# Patient Record
Sex: Female | Born: 1970
Health system: Southern US, Community
[De-identification: ages and names within clinical notes are randomized; demographics above are authoritative.]

## PROBLEM LIST (undated history)

## (undated) DIAGNOSIS — K219 Gastro-esophageal reflux disease without esophagitis: Secondary | ICD-10-CM

## (undated) DIAGNOSIS — Z87440 Personal history of urinary (tract) infections: Secondary | ICD-10-CM

## (undated) DIAGNOSIS — I10 Essential (primary) hypertension: Secondary | ICD-10-CM

## (undated) DIAGNOSIS — Z803 Family history of malignant neoplasm of breast: Secondary | ICD-10-CM

## (undated) HISTORY — DX: Personal history of urinary (tract) infections: Z87.440

## (undated) HISTORY — PX: CARPAL TUNNEL RELEASE: SHX101

## (undated) HISTORY — DX: Family history of malignant neoplasm of breast: Z80.3

## (undated) HISTORY — DX: Gastro-esophageal reflux disease without esophagitis: K21.9

## (undated) HISTORY — PX: THUMB ARTHROSCOPY: SHX2509

## (undated) HISTORY — PX: APPENDECTOMY: SHX54

## (undated) HISTORY — PX: SHOULDER SURGERY: SHX246

## (undated) HISTORY — DX: Essential (primary) hypertension: I10

---

## 1991-01-05 HISTORY — PX: TUBAL LIGATION: SHX77

## 1997-11-26 ENCOUNTER — Other Ambulatory Visit: Admission: RE | Admit: 1997-11-26 | Discharge: 1997-11-26 | Payer: Self-pay | Admitting: Family Medicine

## 1998-03-11 ENCOUNTER — Ambulatory Visit (HOSPITAL_BASED_OUTPATIENT_CLINIC_OR_DEPARTMENT_OTHER): Admission: RE | Admit: 1998-03-11 | Discharge: 1998-03-11 | Payer: Self-pay | Admitting: Plastic Surgery

## 1999-02-11 ENCOUNTER — Other Ambulatory Visit: Admission: RE | Admit: 1999-02-11 | Discharge: 1999-02-11 | Payer: Self-pay | Admitting: *Deleted

## 2000-08-10 ENCOUNTER — Other Ambulatory Visit: Admission: RE | Admit: 2000-08-10 | Discharge: 2000-08-10 | Payer: Self-pay | Admitting: Obstetrics and Gynecology

## 2000-10-06 ENCOUNTER — Ambulatory Visit (HOSPITAL_COMMUNITY): Admission: RE | Admit: 2000-10-06 | Discharge: 2000-10-06 | Payer: Self-pay | Admitting: Obstetrics and Gynecology

## 2000-10-26 ENCOUNTER — Encounter: Payer: Self-pay | Admitting: Gastroenterology

## 2000-10-26 ENCOUNTER — Encounter: Admission: RE | Admit: 2000-10-26 | Discharge: 2000-10-26 | Payer: Self-pay | Admitting: Gastroenterology

## 2000-11-14 ENCOUNTER — Ambulatory Visit (HOSPITAL_BASED_OUTPATIENT_CLINIC_OR_DEPARTMENT_OTHER): Admission: RE | Admit: 2000-11-14 | Discharge: 2000-11-14 | Payer: Self-pay | Admitting: Urology

## 2001-03-08 ENCOUNTER — Encounter: Admission: RE | Admit: 2001-03-08 | Discharge: 2001-05-04 | Payer: Self-pay

## 2004-01-05 HISTORY — PX: RIGHT OOPHORECTOMY: SHX2359

## 2005-06-16 ENCOUNTER — Encounter: Admission: RE | Admit: 2005-06-16 | Discharge: 2005-06-29 | Payer: Self-pay | Admitting: Family Medicine

## 2010-05-28 ENCOUNTER — Ambulatory Visit: Payer: BC Managed Care – PPO | Attending: Neurology | Admitting: Physical Therapy

## 2010-05-28 DIAGNOSIS — M546 Pain in thoracic spine: Secondary | ICD-10-CM | POA: Insufficient documentation

## 2010-05-28 DIAGNOSIS — M542 Cervicalgia: Secondary | ICD-10-CM | POA: Insufficient documentation

## 2010-05-28 DIAGNOSIS — IMO0001 Reserved for inherently not codable concepts without codable children: Secondary | ICD-10-CM | POA: Insufficient documentation

## 2010-06-02 ENCOUNTER — Ambulatory Visit: Payer: BC Managed Care – PPO | Admitting: Physical Therapy

## 2010-06-04 ENCOUNTER — Ambulatory Visit: Payer: BC Managed Care – PPO | Admitting: Physical Therapy

## 2010-06-09 ENCOUNTER — Ambulatory Visit: Payer: BC Managed Care – PPO | Attending: Neurology | Admitting: Physical Therapy

## 2010-06-09 DIAGNOSIS — IMO0001 Reserved for inherently not codable concepts without codable children: Secondary | ICD-10-CM | POA: Insufficient documentation

## 2010-06-09 DIAGNOSIS — M542 Cervicalgia: Secondary | ICD-10-CM | POA: Insufficient documentation

## 2010-06-09 DIAGNOSIS — M546 Pain in thoracic spine: Secondary | ICD-10-CM | POA: Insufficient documentation

## 2010-06-11 ENCOUNTER — Ambulatory Visit: Payer: BC Managed Care – PPO | Admitting: Physical Therapy

## 2010-06-16 ENCOUNTER — Ambulatory Visit: Payer: BC Managed Care – PPO | Admitting: Physical Therapy

## 2010-06-18 ENCOUNTER — Encounter: Payer: BC Managed Care – PPO | Admitting: Physical Therapy

## 2010-06-23 ENCOUNTER — Ambulatory Visit: Payer: BC Managed Care – PPO | Admitting: Physical Therapy

## 2010-06-25 ENCOUNTER — Ambulatory Visit: Payer: BC Managed Care – PPO | Admitting: Physical Therapy

## 2010-06-30 ENCOUNTER — Encounter: Payer: BC Managed Care – PPO | Admitting: Physical Therapy

## 2010-07-02 ENCOUNTER — Encounter: Payer: BC Managed Care – PPO | Admitting: Physical Therapy

## 2010-07-07 ENCOUNTER — Encounter: Payer: BC Managed Care – PPO | Admitting: Physical Therapy

## 2011-03-03 ENCOUNTER — Ambulatory Visit: Payer: Self-pay | Admitting: Obstetrics and Gynecology

## 2011-03-18 ENCOUNTER — Ambulatory Visit: Payer: Self-pay | Admitting: Obstetrics and Gynecology

## 2011-03-18 LAB — HEMOGLOBIN: HGB: 13 g/dL (ref 12.0–16.0)

## 2011-03-25 ENCOUNTER — Ambulatory Visit: Payer: Self-pay | Admitting: Obstetrics and Gynecology

## 2011-03-25 HISTORY — PX: ENDOMETRIAL ABLATION: SHX621

## 2011-04-20 ENCOUNTER — Other Ambulatory Visit: Payer: Self-pay | Admitting: Family Medicine

## 2011-04-20 ENCOUNTER — Ambulatory Visit (HOSPITAL_COMMUNITY)
Admission: RE | Admit: 2011-04-20 | Discharge: 2011-04-20 | Disposition: A | Discharge status: Home / Self Care | Payer: BC Managed Care – PPO | Source: Ambulatory Visit | Attending: Family Medicine | Admitting: Family Medicine

## 2011-04-20 DIAGNOSIS — R1011 Right upper quadrant pain: Secondary | ICD-10-CM

## 2011-04-20 DIAGNOSIS — K838 Other specified diseases of biliary tract: Secondary | ICD-10-CM | POA: Insufficient documentation

## 2011-04-29 ENCOUNTER — Encounter: Payer: Self-pay | Admitting: Gastroenterology

## 2011-05-21 ENCOUNTER — Other Ambulatory Visit (INDEPENDENT_AMBULATORY_CARE_PROVIDER_SITE_OTHER): Payer: BC Managed Care – PPO

## 2011-05-21 ENCOUNTER — Ambulatory Visit (INDEPENDENT_AMBULATORY_CARE_PROVIDER_SITE_OTHER): Payer: BC Managed Care – PPO | Admitting: Gastroenterology

## 2011-05-21 ENCOUNTER — Encounter: Payer: Self-pay | Admitting: Gastroenterology

## 2011-05-21 VITALS — BP 124/68 | HR 60 | Ht 70.0 in | Wt 184.0 lb

## 2011-05-21 DIAGNOSIS — R1011 Right upper quadrant pain: Secondary | ICD-10-CM

## 2011-05-21 LAB — CBC WITH DIFFERENTIAL/PLATELET
Basophils Absolute: 0 10*3/uL (ref 0.0–0.1)
Hemoglobin: 13.8 g/dL (ref 12.0–15.0)
Lymphocytes Relative: 33.1 % (ref 12.0–46.0)
Monocytes Relative: 9.2 % (ref 3.0–12.0)
Neutro Abs: 2.8 10*3/uL (ref 1.4–7.7)
Neutrophils Relative %: 54.2 % (ref 43.0–77.0)
Platelets: 275 10*3/uL (ref 150.0–400.0)
RDW: 13.6 % (ref 11.5–14.6)

## 2011-05-21 LAB — COMPREHENSIVE METABOLIC PANEL
ALT: 15 U/L (ref 0–35)
CO2: 27 mEq/L (ref 19–32)
Calcium: 9.2 mg/dL (ref 8.4–10.5)
Chloride: 112 mEq/L (ref 96–112)
Creatinine, Ser: 0.8 mg/dL (ref 0.4–1.2)
GFR: 79.8 mL/min (ref >60.00)

## 2011-05-21 NOTE — Progress Notes (Signed)
HPI: This is a   very pleasant 41 year old woman whom I am meeting for the first time today.  Sharp RUQ pains starting this past February.  The pains are colicy, occuring daily lately.  30 seconds at an episode, several times a day.  Can also be a constant dull pain.  +constant nausea.  Pain does not radiate.  The pains are not brought on by eating or relieved when moving her bowels.  Takes ibuprofen because of this RUQ pains.  NO other NSAIDs.  She does not get heartburn  Overall she has gained 10 pounds.  [place] see below.  CBC, complete metabolic profile last month were both completely normal.  IMPRESSION from recent US last month: 1. Mildly dilated common bile duct measures up to 0.6 cm. No directly visualized choledocholithiasis or intrahepatic biliary dilatation. Because of the mild CBD dilatation is not observed. 2. There are two small hepatic cysts which appear benign and incidental.    Review of systems: Pertinent positive and negative review of systems were noted in the above HPI section. Complete review of systems was performed and was otherwise normal.    Past Medical History  Diagnosis Date  . Hx: UTI (urinary tract infection)   . Endometriosis   . GERD (gastroesophageal reflux disease)     Past Surgical History  Procedure Date  . Appendectomy   . Right oophorectomy     Current Outpatient Prescriptions  Medication Sig Dispense Refill  . ibuprofen (ADVIL,MOTRIN) 200 MG tablet Take 200 mg by mouth as needed.      . promethazine (PHENERGAN) 25 MG tablet Take 25 mg by mouth every 6 (six) hours as needed.        Allergies as of 05/21/2011  . (No Known Allergies)    Family History  Problem Relation Age of Onset  . Colon cancer Neg Hx   . Breast cancer Paternal Grandmother     History   Social History  . Marital Status: Married    Spouse Name: N/A    Number of Children: 2  . Years of Education: N/A   Occupational History  . Furniture conservator/restorer    Social  History Main Topics  . Smoking status: Never Smoker   . Smokeless tobacco: Never Used  . Alcohol Use: Yes     occ  . Drug Use: No  . Sexually Active: Not on file   Other Topics Concern  . Not on file   Social History Narrative   Daily caffeine        Physical Exam: BP 124/68  Pulse 60  Ht 5\' 10"  (1.778 m)  Wt 184 lb (83.462 kg)  BMI 26.40 kg/m2 Constitutional: generally well-appearing Psychiatric: alert and oriented x3 Eyes: extraocular movements intact Mouth: oral pharynx moist, no lesions Neck: supple no lymphadenopathy Cardiovascular: heart regular rate and rhythm Lungs: clear to auscultation bilaterally Abdomen: soft, nontender, nondistended, no obvious ascites, no peritoneal signs, normal bowel sounds Extremities: no lower extremity edema bilaterally Skin: no lesions on visible extremities    Assessment and plan: 41 y.o. female with  intermittent right upper quadrant pains  Her symptoms are most consistent with a biliary problem. Gallbladder looked normal on ultrasound however I am going to set up a HIDA scan to estimate gallbladder ejection fraction, check for biliary dyskinesia. The ultrasound also suggested a slightly dilated, upper limit of normal bile duct. Perhaps this is a clue to her problem. If HIDA scan is not helpful then I would set up MRI  abdomen with MRCP images. She is going to get repeat CBC and complete profile today.  Her symptoms do not seem gastric.

## 2011-05-21 NOTE — Patient Instructions (Addendum)
You will have labs checked today in the basement lab.  Please head down after you check out with the front desk  (cbc, cmet). HIDA scan to estimate GB ejection fraction (functional test of your GB).  If this is not helpful, then MRI/MRCP. You will need to arrive at [facility] on 05/26/11 at 12:45 pm for a 1 pm appointment.  Please have nothing to eat or drink for 6 hours prior to the test.

## 2011-05-26 ENCOUNTER — Encounter (HOSPITAL_COMMUNITY): Payer: Self-pay

## 2011-05-26 ENCOUNTER — Encounter (HOSPITAL_COMMUNITY)
Admission: RE | Admit: 2011-05-26 | Discharge: 2011-05-26 | Disposition: A | Discharge status: Home / Self Care | Payer: BC Managed Care – PPO | Source: Ambulatory Visit | Attending: Gastroenterology | Admitting: Gastroenterology

## 2011-05-26 DIAGNOSIS — R1011 Right upper quadrant pain: Secondary | ICD-10-CM

## 2011-05-26 DIAGNOSIS — R112 Nausea with vomiting, unspecified: Secondary | ICD-10-CM | POA: Insufficient documentation

## 2011-05-26 MED ORDER — TECHNETIUM TC 99M MEBROFENIN IV KIT
4.9000 | PACK | Freq: Once | INTRAVENOUS | Status: AC | PRN
  Administered 2011-05-26: 4.9 via INTRAVENOUS

## 2011-05-26 MED ORDER — SINCALIDE 5 MCG IJ SOLR: 0.0200 ug/kg | Freq: Once | INTRAMUSCULAR | Status: DC

## 2011-05-28 ENCOUNTER — Other Ambulatory Visit: Payer: Self-pay

## 2011-05-28 DIAGNOSIS — R1011 Right upper quadrant pain: Secondary | ICD-10-CM

## 2011-05-28 NOTE — Progress Notes (Signed)
Pt has been scheduled for MRI on 06/04/11 NPO 4 hours arrive at 1145 am pt is aware of the date and time and instructions

## 2011-06-02 ENCOUNTER — Other Ambulatory Visit: Payer: Self-pay | Admitting: Gastroenterology

## 2011-06-02 DIAGNOSIS — R1011 Right upper quadrant pain: Secondary | ICD-10-CM

## 2011-06-04 ENCOUNTER — Ambulatory Visit (HOSPITAL_COMMUNITY)
Admission: RE | Admit: 2011-06-04 | Discharge: 2011-06-04 | Disposition: A | Discharge status: Home / Self Care | Payer: BC Managed Care – PPO | Source: Ambulatory Visit | Attending: Gastroenterology | Admitting: Gastroenterology

## 2011-06-04 DIAGNOSIS — R1011 Right upper quadrant pain: Secondary | ICD-10-CM | POA: Insufficient documentation

## 2011-06-04 MED ORDER — GADOBENATE DIMEGLUMINE 529 MG/ML IV SOLN
20.0000 mL | Freq: Once | INTRAVENOUS | Status: AC | PRN
  Administered 2011-06-04: 17 mL via INTRAVENOUS

## 2011-06-09 ENCOUNTER — Telehealth: Payer: Self-pay | Admitting: Gastroenterology

## 2011-06-09 NOTE — Telephone Encounter (Signed)
MRI shows normal bile duct. No clear cause of her pains. Lets proceed with EGD given otherwise normal examination (LEC)  Pt has been scheduled for EGD and Previsit letter mailed

## 2011-06-22 ENCOUNTER — Encounter: Payer: Self-pay | Admitting: Gastroenterology

## 2011-06-22 ENCOUNTER — Ambulatory Visit (AMBULATORY_SURGERY_CENTER): Payer: BC Managed Care – PPO

## 2011-06-22 VITALS — Ht 70.0 in | Wt 186.7 lb

## 2011-06-22 DIAGNOSIS — G8929 Other chronic pain: Secondary | ICD-10-CM

## 2011-06-22 DIAGNOSIS — R112 Nausea with vomiting, unspecified: Secondary | ICD-10-CM

## 2011-06-22 DIAGNOSIS — R1011 Right upper quadrant pain: Secondary | ICD-10-CM

## 2011-07-01 ENCOUNTER — Telehealth: Payer: Self-pay

## 2011-07-01 NOTE — Telephone Encounter (Signed)
Message copied by Donata Duff on Thu Jul 01, 2011  8:03 AM ------      Message from: Rob Bunting P      Created: Thu Jul 01, 2011  7:37 AM       She is scheduled for 4pm egd tomorro [facility], can you see if she'll reschedule for 1:30 pm instead?            Thanks

## 2011-07-01 NOTE — Telephone Encounter (Signed)
Pt agreed to come in at 130 pm for appt she was re instructed for the new time

## 2011-07-02 ENCOUNTER — Ambulatory Visit (AMBULATORY_SURGERY_CENTER): Payer: BC Managed Care – PPO | Admitting: Gastroenterology

## 2011-07-02 ENCOUNTER — Encounter: Payer: Self-pay | Admitting: Gastroenterology

## 2011-07-02 VITALS — BP 108/69 | HR 65 | Temp 98.7°F | Resp 18 | Ht 70.0 in | Wt 186.0 lb

## 2011-07-02 DIAGNOSIS — R112 Nausea with vomiting, unspecified: Secondary | ICD-10-CM

## 2011-07-02 DIAGNOSIS — R1011 Right upper quadrant pain: Secondary | ICD-10-CM

## 2011-07-02 MED ORDER — SODIUM CHLORIDE 0.9 % IV SOLN: 500.0000 mL | INTRAVENOUS | Status: DC

## 2011-07-02 NOTE — Op Note (Signed)
[facility] [address] [place], [place]  [address]  ENDOSCOPY PROCEDURE REPORT  PATIENT:  Courtney, Brooks  MR#:  [id number] BIRTHDATE:  1970/03/02, 40 yrs. old  GENDER:  female ENDOSCOPIST:  Rachael Fee, MD Referred by:  Rudi Heap, M.D. PROCEDURE DATE:  07/02/2011 PROCEDURE:  EGD, diagnostic 43235 ASA CLASS:  Class II INDICATIONS:  intermittent RUQ pain, normal [place], normal HIDA, essentially normal MRCP (cbd 5mm), normal LFTs MEDICATIONS:  Fentanyl 50 mcg IV, These medications were titrated to patient response per physician's verbal order, Versed 5 mg IV TOPICAL ANESTHETIC:  none  DESCRIPTION OF PROCEDURE:   After the risks benefits and alternatives of the procedure were thoroughly explained, informed consent was obtained.  The LB GIF-H180 [id number] endoscope was introduced through the mouth and advanced to the second portion of the duodenum, without limitations.  The instrument was slowly withdrawn as the mucosa was fully examined. <<PROCEDUREIMAGES>> The upper, middle, and distal third of the esophagus were carefully inspected and no abnormalities were noted. The z-line was well seen at the GEJ. The endoscope was pushed into the fundus which was normal including a retroflexed view. The antrum,gastric body, first and second part of the duodenum were unremarkable (see image1, image2, image3, image4, and image5).    Retroflexed views revealed no abnormalities.    The scope was then withdrawn from the patient and the procedure completed. COMPLICATIONS:  None  ENDOSCOPIC IMPRESSION: 1) Normal EGD  RECOMMENDATIONS: Still no clear reason for her pains.  Will treat her underlying constipation (can go 4-5 days without BM) and see if that helps. Start miralax one dose daily and then return to see me in the office in 5-6 weeks.  ______________________________ Rachael Fee, MD  n. eSIGNED:   Rachael Fee at 07/02/2011 01:43 PM  Theodis Sato, [phone]

## 2011-07-02 NOTE — Patient Instructions (Addendum)
YOU HAD AN ENDOSCOPIC PROCEDURE TODAY AT THE [facility]: Refer to the procedure report that was given to you for any specific questions about what was found during the examination.  If the procedure report does not answer your questions, please call your gastroenterologist to clarify.  If you requested that your care partner not be given the details of your procedure findings, then the procedure report has been included in a sealed envelope for you to review at your convenience later.  YOU SHOULD EXPECT: Some feelings of bloating in the abdomen. Passage of more gas than usual.  Walking can help get rid of the air that was put into your GI tract during the procedure and reduce the bloating. If you had a lower endoscopy (such as a colonoscopy or flexible sigmoidoscopy) you may notice spotting of blood in your stool or on the toilet paper. If you underwent a bowel prep for your procedure, then you may not have a normal bowel movement for a few days.  DIET: Your first meal following the procedure should be a light meal and then it is ok to progress to your normal diet.  A half-sandwich or bowl of soup is an example of a good first meal.  Heavy or fried foods are harder to digest and may make you feel nauseous or bloated.  Likewise meals heavy in dairy and vegetables can cause extra gas to form and this can also increase the bloating.  Drink plenty of fluids but you should avoid alcoholic beverages for 24 hours.  ACTIVITY: Your care partner should take you home directly after the procedure.  You should plan to take it easy, moving slowly for the rest of the day.  You can resume normal activity the day after the procedure however you should NOT DRIVE or use heavy machinery for 24 hours (because of the sedation medicines used during the test).    SYMPTOMS TO REPORT IMMEDIATELY: A gastroenterologist can be reached at any hour.  During normal business hours, 8:30 AM to 5:00 PM Monday through Friday,  call [phone].  After hours and on weekends, please call the GI answering service at [phone] who will take a message and have the physician on call contact you.   Following lower endoscopy (colonoscopy or flexible sigmoidoscopy):  Excessive amounts of blood in the stool  Significant tenderness or worsening of abdominal pains  Swelling of the abdomen that is new, acute  Fever of 100F or higher  Following upper endoscopy (EGD)  Vomiting of blood or coffee ground material  New chest pain or pain under the shoulder blades  Painful or persistently difficult swallowing  New shortness of breath  Fever of 100F or higher  Black, tarry-looking stools  FOLLOW UP: If any biopsies were taken you will be contacted by phone or by letter within the next 1-3 weeks.  Call your gastroenterologist if you have not heard about the biopsies in 3 weeks.  Our staff will call the home number listed on your records the next business day following your procedure to check on you and address any questions or concerns that you may have at that time regarding the information given to you following your procedure. This is a courtesy call and so if there is no answer at the home number and we have not heard from you through the emergency physician on call, we will assume that you have returned to your regular daily activities without incident.  SIGNATURES/CONFIDENTIALITY: You and/or your care   partner have signed paperwork which will be entered into your electronic medical record.  These signatures attest to the fact that that the information above on your After Visit Summary has been reviewed and is understood.  Full responsibility of the confidentiality of this discharge information lies with you and/or your care-partner.  

## 2011-07-02 NOTE — Progress Notes (Signed)
Patient did not experience any of the following events: a burn prior to discharge; a fall within the facility; wrong site/side/patient/procedure/implant event; or a hospital transfer or hospital admission upon discharge from the facility. (G8907) Patient did not have preoperative order for IV antibiotic SSI prophylaxis. (G8918)  

## 2011-07-05 ENCOUNTER — Telehealth: Payer: Self-pay | Admitting: *Deleted

## 2011-07-05 NOTE — Telephone Encounter (Signed)
  Follow up Call-  Call back number 07/02/2011  Post procedure Call Back phone  # [phone]  Permission to leave phone message Yes     Patient questions:  Do you have a fever, pain , or abdominal swelling? no Pain Score  0 *  Have you tolerated food without any problems? yes  Have you been able to return to your normal activities? yes  Do you have any questions about your discharge instructions: Diet   no Medications  no Follow up visit  no  Do you have questions or concerns about your Care? no  Actions: * If pain score is 4 or above: No action needed, pain <4.

## 2011-08-13 ENCOUNTER — Ambulatory Visit (INDEPENDENT_AMBULATORY_CARE_PROVIDER_SITE_OTHER): Payer: BC Managed Care – PPO | Admitting: Gastroenterology

## 2011-08-13 ENCOUNTER — Encounter: Payer: Self-pay | Admitting: Gastroenterology

## 2011-08-13 VITALS — BP 100/70 | HR 78 | Ht 70.0 in | Wt 186.0 lb

## 2011-08-13 DIAGNOSIS — K5909 Other constipation: Secondary | ICD-10-CM

## 2011-08-13 DIAGNOSIS — K5900 Constipation, unspecified: Secondary | ICD-10-CM

## 2011-08-13 DIAGNOSIS — R1011 Right upper quadrant pain: Secondary | ICD-10-CM

## 2011-08-13 MED ORDER — LINACLOTIDE 145 MCG PO CAPS: 145.0000 ug | ORAL_CAPSULE | Freq: Every day | ORAL | Status: DC

## 2011-08-13 MED ORDER — MOVIPREP 100 G PO SOLR: 1.0000 | ORAL | Status: DC

## 2011-08-13 NOTE — Progress Notes (Signed)
Review of pertinent gastrointestinal problems: 1.  intermittent right upper quadrant pain (spring 2013): CBC, complete metabolic profile last month were both completely normal.  [place] may 2013: Mildly dilated common bile duct measures up to 0.6 cm. No directly visualized choledocholithiasis or intrahepatic biliary dilatation. Because of the mild CBD dilatation is not observed. 2. There are two small hepatic cysts which appear benign and incidental.  HIDA scan may 2013 was normal.  MRI with MRCP images June 2013 suggested upper limit normal common bile duct otherwise normal examination.  EGD 06/2011 was normal.     HPI: This is a  very pleasant 41 year old woman whom I last saw about a month or 2 ago at the time of an EGD.  Still has intermittent RUQ pains.  The real bad attacks tend to be around first of the month.  They occur 3-4 times a week.  + associated nausea, + vomiting.  No fevers or chills.  Tried miralax, once daily for chronic constipation. Normally she will have one BM a week.  Miralax one a day made no difference.   Past Medical History  Diagnosis Date  . Hx: UTI (urinary tract infection)   . Endometriosis   . GERD (gastroesophageal reflux disease)     Past Surgical History  Procedure Date  . Appendectomy   . Right oophorectomy     Current Outpatient Prescriptions  Medication Sig Dispense Refill  . ibuprofen (ADVIL,MOTRIN) 200 MG tablet Take 200 mg by mouth as needed.      . promethazine (PHENERGAN) 25 MG tablet Take 25 mg by mouth every 6 (six) hours as needed.        Allergies as of 08/13/2011  . (No Known Allergies)    Family History  Problem Relation Age of Onset  . Colon cancer Neg Hx   . Breast cancer Paternal Grandmother   . Heart disease Father     History   Social History  . Marital Status: Married    Spouse Name: N/A    Number of Children: 2  . Years of Education: N/A   Occupational History  . Furniture conservator/restorer    Social History Main Topics  .  Smoking status: Never Smoker   . Smokeless tobacco: Never Used  . Alcohol Use: 2.0 oz/week    4 drink(s) per week     occ  . Drug Use: No  . Sexually Active: Not on file   Other Topics Concern  . Not on file   Social History Narrative   Daily caffeine       Physical Exam: BP 100/70  Pulse 78  Ht 5\' 10"  (1.778 m)  Wt 186 lb (84.369 kg)  BMI 26.69 kg/m2 Constitutional: generally well-appearing Psychiatric: alert and oriented x3 Abdomen: soft, nontender, nondistended, no obvious ascites, no peritoneal signs, normal bowel sounds     Assessment and plan: 41 y.o. female with chronic constipation, intermittent right upper quadrant pains  She has a bowel movement about once a week only. This level of constipation can certainly cause a variety of abdominal discomforts. I want to continue to go down this pathway of chronic constipation. I'm giving her samples of the newer once daily medication to try to treat her constipation. She will stay on MiraLax once daily as well. We will set her up with colonoscopy to rule out significant structural causes of her constipation as well.  My hope is that we could get her moving her bowels more regularly, 2-3 times a week or  so and then judged how her abdominal discomforts are.

## 2011-08-13 NOTE — Patient Instructions (Addendum)
We will continue to blame your consitpation for your right sided pains for now. Trial of linzness (low dose, once daily), 3 weeks of samples given. Stay on once daily miralax for now. You will be set up for a colonoscopy (LEC, moderate sedation) for chronic constipation.

## 2011-08-16 ENCOUNTER — Encounter: Payer: Self-pay | Admitting: Gastroenterology

## 2011-08-17 ENCOUNTER — Encounter: Payer: Self-pay | Admitting: Gastroenterology

## 2011-08-17 ENCOUNTER — Ambulatory Visit (AMBULATORY_SURGERY_CENTER): Payer: BC Managed Care – PPO | Admitting: Gastroenterology

## 2011-08-17 VITALS — BP 116/78 | HR 82 | Temp 99.2°F | Resp 15 | Ht 70.0 in | Wt 186.0 lb

## 2011-08-17 DIAGNOSIS — K6389 Other specified diseases of intestine: Secondary | ICD-10-CM

## 2011-08-17 DIAGNOSIS — R1011 Right upper quadrant pain: Secondary | ICD-10-CM

## 2011-08-17 DIAGNOSIS — K5900 Constipation, unspecified: Secondary | ICD-10-CM

## 2011-08-17 HISTORY — PX: COLONOSCOPY: SHX174

## 2011-08-17 MED ORDER — SODIUM CHLORIDE 0.9 % IV SOLN: 500.0000 mL | INTRAVENOUS | Status: DC

## 2011-08-17 NOTE — Op Note (Signed)
[facility] [address] [place], [place]  [address]  COLONOSCOPY PROCEDURE REPORT  PATIENT:  Courtney Brooks, Courtney Brooks  MR#:  [id number] BIRTHDATE:  23-Jul-1970, 41 yrs. old  GENDER:  female ENDOSCOPIST:  Rachael Fee, MD PROCEDURE DATE:  08/17/2011 PROCEDURE:  Colonoscopy 52841 ASA CLASS:  Class II INDICATIONS:  chronic constipation, intermittent RUQ pains MEDICATIONS:   Fentanyl 75 mcg IV, These medications were titrated to patient response per physician's verbal order, Versed 8 mg IV  DESCRIPTION OF PROCEDURE:   After the risks benefits and alternatives of the procedure were thoroughly explained, informed consent was obtained.  Digital rectal exam was performed and revealed no rectal masses.   The LB [id number] endoscope was introduced through the anus and advanced to the cecum, which was identified by both the appendix and ileocecal valve, without limitations.  The quality of the prep was good..  The instrument was then slowly withdrawn as the colon was fully examined. <<PROCEDUREIMAGES>> FINDINGS:  Melanosis coli was found throughout the colon (see image1).  This was otherwise a normal examination of the colon (see image2, image3, and image4).   Retroflexed views in the rectum revealed no abnormalities. COMPLICATIONS:  None ENDOSCOPIC IMPRESSION: 1) Melanosis staining throughout the colon 2) Otherwise normal examination; no polyps or cancers  RECOMMENDATIONS: 1) You should continue to follow colorectal cancer screening guidelines for "routine risk" patients with a repeat colonoscopy in 10 years. There is no need for FOBT (stool) testing for at least 5 years. 2) Continue the Linzess samples you were given last week and call Dr. Christella Hartigan' office to report on your symptoms in 2 weeks.  REPEAT EXAM:  10 years  ______________________________ Rachael Fee, MD  n. eSIGNED:   Rachael Fee at 08/17/2011 01:55 PM  Theodis Sato, [phone]

## 2011-08-17 NOTE — Patient Instructions (Addendum)

## 2011-08-17 NOTE — Progress Notes (Signed)
Patient did not experience any of the following events: a burn prior to discharge; a fall within the facility; wrong site/side/patient/procedure/implant event; or a hospital transfer or hospital admission upon discharge from the facility. (G8907) Patient did not have preoperative order for IV antibiotic SSI prophylaxis. (G8918)  

## 2011-08-18 ENCOUNTER — Telehealth: Payer: Self-pay

## 2011-08-18 NOTE — Telephone Encounter (Signed)
Left message on answering machine. 

## 2011-09-08 ENCOUNTER — Telehealth: Payer: Self-pay | Admitting: Gastroenterology

## 2011-09-08 NOTE — Telephone Encounter (Signed)
Pt says Linzess is working very well, but pains in her right side have gotten worse since her procedure.  Please advise

## 2011-09-08 NOTE — Telephone Encounter (Signed)
She's had [place], HIDA, EGD, colonoscopy, MRI/MRCP;  No explanation for the right sided abd pains.  I'm glad her constipation is improved on linzess.  Please call in linzess pills, take one pill once daily, disp 30 with 11 refills.  She should follow up with PCP for other testing for right sided pains (after all the above testing, not clear that the pains is gi related) and have rov with me in 2-3 months for update on her constipatoin.

## 2011-09-09 NOTE — Telephone Encounter (Signed)
Left message on machine to call back  

## 2011-09-09 NOTE — Telephone Encounter (Signed)
Pt is aware and needs no refills on Linzess

## 2012-05-19 ENCOUNTER — Encounter: Payer: Self-pay | Admitting: Family Medicine

## 2012-05-19 ENCOUNTER — Ambulatory Visit (INDEPENDENT_AMBULATORY_CARE_PROVIDER_SITE_OTHER): Payer: BC Managed Care – PPO | Admitting: Family Medicine

## 2012-05-19 VITALS — BP 135/81 | HR 99 | Temp 97.7°F | Ht 70.0 in | Wt 181.0 lb

## 2012-05-19 DIAGNOSIS — H01009 Unspecified blepharitis unspecified eye, unspecified eyelid: Secondary | ICD-10-CM

## 2012-05-19 DIAGNOSIS — H01006 Unspecified blepharitis left eye, unspecified eyelid: Secondary | ICD-10-CM

## 2012-05-19 MED ORDER — DOXYCYCLINE HYCLATE 50 MG PO CAPS: 100.0000 mg | ORAL_CAPSULE | Freq: Two times a day (BID) | ORAL | Status: DC

## 2012-05-19 MED ORDER — METHYLPREDNISOLONE ACETATE 40 MG/ML IJ SUSP
80.0000 mg | Freq: Once | INTRAMUSCULAR | Status: AC
  Administered 2012-05-19: 80 mg via INTRAMUSCULAR

## 2012-05-19 MED ORDER — OLOPATADINE HCL 0.1 % OP SOLN: 1.0000 [drp] | Freq: Two times a day (BID) | OPHTHALMIC | Status: DC

## 2012-05-19 MED ORDER — AZITHROMYCIN 1 % OP SOLN: 1.0000 [drp] | Freq: Two times a day (BID) | OPHTHALMIC | Status: DC

## 2012-05-22 NOTE — Progress Notes (Signed)
  Subjective:    Patient ID: Courtney Brooks, female    DOB: 14-May-1970, 42 y.o.   MRN: [id number]  HPI Bilateral eye irritation x 1-2 days.  Initially noticed itching and irritation. Now with mild L eyelid swelling No eye pain, fever, LOV No headache No temporal tenderness Minimal eye drainage.  No recent eye trauma.  Pt has been working outdoors.  Baseline hx/o allergies   Review of Systems  All other systems reviewed and are negative.       Objective:   Physical Exam  Constitutional: She appears well-developed and well-nourished.  HENT:  Head: Normocephalic and atraumatic.  Right Ear: External ear normal.  Left Ear: External ear normal.  Eyes: Pupils are equal, round, and reactive to light.  Bilateral mild eye conjunctivitis L upper eyelid swelling EOMI No photophobia    Neck: Normal range of motion.  Cardiovascular: Normal rate and regular rhythm.   Pulmonary/Chest: Effort normal.  Abdominal: Soft.  Neurological: She is alert.  Skin: Skin is warm.          Assessment & Plan:  Conjunctivitis and Blepharitis: Likely allergic and viral overlaps. Will place on doxy and op azithro for infectious coverage.  Patanol for allergic component.  Discussed general and ophthalmologic red flags  Follow up as needed.      The patient and/or caregiver has been counseled thoroughly with regard to treatment plan and/or medications prescribed including dosage, schedule, interactions, rationale for use, and possible side effects and they verbalize understanding. Diagnoses and expected course of recovery discussed and will return if not improved as expected or if the condition worsens. Patient and/or caregiver verbalized understanding.

## 2012-09-21 ENCOUNTER — Emergency Department (HOSPITAL_COMMUNITY)
Admission: EM | Admit: 2012-09-21 | Discharge: 2012-09-21 | Disposition: A | Discharge status: Home / Self Care | Payer: Worker's Compensation | Attending: Emergency Medicine | Admitting: Emergency Medicine

## 2012-09-21 ENCOUNTER — Encounter (HOSPITAL_COMMUNITY): Payer: Self-pay | Admitting: *Deleted

## 2012-09-21 ENCOUNTER — Emergency Department (HOSPITAL_COMMUNITY): Payer: Worker's Compensation

## 2012-09-21 DIAGNOSIS — M5442 Lumbago with sciatica, left side: Secondary | ICD-10-CM

## 2012-09-21 DIAGNOSIS — Z792 Long term (current) use of antibiotics: Secondary | ICD-10-CM | POA: Insufficient documentation

## 2012-09-21 DIAGNOSIS — M5430 Sciatica, unspecified side: Secondary | ICD-10-CM | POA: Insufficient documentation

## 2012-09-21 DIAGNOSIS — R52 Pain, unspecified: Secondary | ICD-10-CM | POA: Insufficient documentation

## 2012-09-21 DIAGNOSIS — Z791 Long term (current) use of non-steroidal anti-inflammatories (NSAID): Secondary | ICD-10-CM | POA: Insufficient documentation

## 2012-09-21 DIAGNOSIS — Z8719 Personal history of other diseases of the digestive system: Secondary | ICD-10-CM | POA: Insufficient documentation

## 2012-09-21 DIAGNOSIS — IMO0002 Reserved for concepts with insufficient information to code with codable children: Secondary | ICD-10-CM | POA: Insufficient documentation

## 2012-09-21 DIAGNOSIS — Z79899 Other long term (current) drug therapy: Secondary | ICD-10-CM | POA: Insufficient documentation

## 2012-09-21 DIAGNOSIS — Z87440 Personal history of urinary (tract) infections: Secondary | ICD-10-CM | POA: Insufficient documentation

## 2012-09-21 DIAGNOSIS — Z8742 Personal history of other diseases of the female genital tract: Secondary | ICD-10-CM | POA: Insufficient documentation

## 2012-09-21 LAB — RAPID URINE DRUG SCREEN, HOSP PERFORMED
Barbiturates: NOT DETECTED
Benzodiazepines: NOT DETECTED
Cocaine: NOT DETECTED
Tetrahydrocannabinol: NOT DETECTED

## 2012-09-21 MED ORDER — KETOROLAC TROMETHAMINE 30 MG/ML IJ SOLN
30.0000 mg | Freq: Once | INTRAMUSCULAR | Status: AC
  Administered 2012-09-21: 30 mg via INTRAVENOUS
  Filled 2012-09-21: qty 1

## 2012-09-21 MED ORDER — MORPHINE SULFATE 4 MG/ML IJ SOLN
4.0000 mg | Freq: Once | INTRAMUSCULAR | Status: AC
  Administered 2012-09-21: 4 mg via INTRAVENOUS
  Filled 2012-09-21: qty 1

## 2012-09-21 MED ORDER — PREDNISONE 20 MG PO TABS: ORAL_TABLET | ORAL | Status: DC

## 2012-09-21 MED ORDER — SODIUM CHLORIDE 0.9 % IV BOLUS (SEPSIS)
500.0000 mL | Freq: Once | INTRAVENOUS | Status: AC
  Administered 2012-09-21: 500 mL via INTRAVENOUS

## 2012-09-21 MED ORDER — ONDANSETRON HCL 4 MG/2ML IJ SOLN
4.0000 mg | Freq: Once | INTRAMUSCULAR | Status: AC
  Administered 2012-09-21: 4 mg via INTRAVENOUS
  Filled 2012-09-21: qty 2

## 2012-09-21 MED ORDER — NAPROXEN 500 MG PO TABS: 500.0000 mg | ORAL_TABLET | Freq: Two times a day (BID) | ORAL | Status: DC

## 2012-09-21 NOTE — ED Notes (Signed)
Discharge and follow up instructions reviewed with pt. Pt verbalized understanding.  

## 2012-09-21 NOTE — ED Provider Notes (Signed)
CSN: [id number]     Arrival date & time 09/21/12  1059 History   First MD Initiated Contact with Patient 09/21/12 1142     Chief Complaint  Patient presents with  . Back Pain   (Consider location/radiation/quality/duration/timing/severity/associated sxs/prior Treatment) HPI.... abrupt onset of sharp low back pain after twisting movement at work. Patient felt a popping sensation. Pain radiates to left buttocks.  No bowel or bladder incontinence.  Positioning, movement and palpation makes pain worse.  No previous back problems   Past Medical History  Diagnosis Date  . Hx: UTI (urinary tract infection)   . Endometriosis   . GERD (gastroesophageal reflux disease)    Past Surgical History  Procedure Laterality Date  . Appendectomy    . Right oophorectomy     Family History  Problem Relation Age of Onset  . Colon cancer Neg Hx   . Breast cancer Paternal Grandmother   . Heart disease Father    History  Substance Use Topics  . Smoking status: Never Smoker   . Smokeless tobacco: Never Used  . Alcohol Use: 2.0 oz/week    4 drink(s) per week     Comment: occ   OB History   Grav Para Term Preterm Abortions TAB SAB Ect Mult Living                 Review of Systems  All other systems reviewed and are negative.    Allergies  Review of patient's allergies indicates no known allergies.  Home Medications   Current Outpatient Rx  Name  Route  Sig  Dispense  Refill  . ibuprofen (ADVIL,MOTRIN) 200 MG tablet   Oral   Take 200 mg by mouth as needed.         . Linaclotide (LINZESS) 145 MCG CAPS   Oral   Take 1 capsule (145 mcg total) by mouth daily.   30 capsule   6   . azithromycin (AZASITE) 1 % ophthalmic solution   Left Eye   Place 1 drop into the left eye 2 (two) times daily.   2.5 mL   0   . doxycycline (VIBRAMYCIN) 50 MG capsule   Oral   Take 2 capsules (100 mg total) by mouth 2 (two) times daily.   28 capsule   0   . naproxen (NAPROSYN) 500 MG tablet    Oral   Take 1 tablet (500 mg total) by mouth 2 (two) times daily.   30 tablet   0   . predniSONE (DELTASONE) 20 MG tablet      3 tabs po day one, then 2 tabs daily x 4 days   11 tablet   0    BP 98/74  Pulse 91  Temp(Src) 98.4 F (36.9 C) (Oral)  Resp 18  SpO2 100% Physical Exam  Nursing note and vitals reviewed. Constitutional: She is oriented to person, place, and time. She appears well-developed and well-nourished.  HENT:  Head: Normocephalic and atraumatic.  Eyes: Conjunctivae and EOM are normal. Pupils are equal, round, and reactive to light.  Neck: Normal range of motion. Neck supple.  Cardiovascular: Normal rate, regular rhythm and normal heart sounds.   Pulmonary/Chest: Effort normal and breath sounds normal.  Abdominal: Soft. Bowel sounds are normal.  Musculoskeletal:  Tender left lower back.  Neurological: She is alert and oriented to person, place, and time.  Pain with straight leg raise on left side  Skin: Skin is warm and dry.  Psychiatric: She has a normal  mood and affect.    ED Course  Procedures (including critical care time) Labs Review Labs Reviewed  URINE RAPID DRUG SCREEN (HOSP PERFORMED)   Imaging Review Dg Lumbar Spine Complete  09/21/2012   *RADIOLOGY REPORT*  Clinical Data: Low back and left leg pain.  LUMBAR SPINE - COMPLETE 4+ VIEW  Comparison: CT scan dated 02/04/2012  Findings: There is no fracture, bone destruction, disc space narrowing, or subluxation.  There is moderate bilateral facet arthritis at L3-4 and slight facet arthritis at L4-5 bilaterally.  IMPRESSION: No acute abnormalities.  Degenerative facet arthritis at L3-4 and L4-5.   Original Report Authenticated By: Francene Boyers, M.D.    MDM   1. Acute back pain with sciatica, left    Plain films reveal no acute abnormalities.   Discharge medications prednisone Naprosyn 500 mg and prednisone. Referral to neurosurgery.  Patient given morphine in emergency department which could  reflect in her drug screen    Donnetta Hutching, MD 09/21/12 1347

## 2012-09-21 NOTE — ED Notes (Signed)
Pt states she began having lower left back pain that started about an hour ago while she was at work after doing some lifting. Pt rates pain 8/10. Pt denies any hx of back problems.

## 2012-09-21 NOTE — ED Notes (Signed)
Pt arrived by ptar. Reports turning at work and felt something pop, now having severe lower back pain, beginning to radiate down left leg. Denies incontinence. Denies hx of back problems.

## 2012-10-30 ENCOUNTER — Ambulatory Visit: Payer: Worker's Compensation | Attending: Occupational Medicine | Admitting: Physical Therapy

## 2012-10-30 DIAGNOSIS — IMO0001 Reserved for inherently not codable concepts without codable children: Secondary | ICD-10-CM | POA: Insufficient documentation

## 2012-10-30 DIAGNOSIS — Z96659 Presence of unspecified artificial knee joint: Secondary | ICD-10-CM | POA: Insufficient documentation

## 2012-10-30 DIAGNOSIS — R5381 Other malaise: Secondary | ICD-10-CM | POA: Insufficient documentation

## 2012-10-30 DIAGNOSIS — M25669 Stiffness of unspecified knee, not elsewhere classified: Secondary | ICD-10-CM | POA: Insufficient documentation

## 2012-10-30 DIAGNOSIS — M25569 Pain in unspecified knee: Secondary | ICD-10-CM | POA: Insufficient documentation

## 2012-11-02 ENCOUNTER — Ambulatory Visit: Payer: Worker's Compensation

## 2012-11-07 ENCOUNTER — Ambulatory Visit: Payer: Worker's Compensation | Attending: Occupational Medicine

## 2012-11-07 DIAGNOSIS — M25669 Stiffness of unspecified knee, not elsewhere classified: Secondary | ICD-10-CM | POA: Insufficient documentation

## 2012-11-07 DIAGNOSIS — R5381 Other malaise: Secondary | ICD-10-CM | POA: Insufficient documentation

## 2012-11-07 DIAGNOSIS — IMO0001 Reserved for inherently not codable concepts without codable children: Secondary | ICD-10-CM | POA: Insufficient documentation

## 2012-11-07 DIAGNOSIS — M25569 Pain in unspecified knee: Secondary | ICD-10-CM | POA: Insufficient documentation

## 2012-11-07 DIAGNOSIS — Z96659 Presence of unspecified artificial knee joint: Secondary | ICD-10-CM | POA: Insufficient documentation

## 2012-11-09 ENCOUNTER — Ambulatory Visit: Payer: Worker's Compensation

## 2012-11-14 ENCOUNTER — Ambulatory Visit: Payer: Worker's Compensation | Admitting: Physical Therapy

## 2012-11-16 ENCOUNTER — Ambulatory Visit: Payer: Worker's Compensation | Admitting: Physical Therapy

## 2012-11-21 ENCOUNTER — Ambulatory Visit: Payer: Worker's Compensation | Admitting: *Deleted

## 2012-11-23 ENCOUNTER — Encounter: Payer: Self-pay | Admitting: Physical Therapy

## 2012-11-28 ENCOUNTER — Ambulatory Visit: Payer: Worker's Compensation | Admitting: *Deleted

## 2012-12-05 ENCOUNTER — Ambulatory Visit: Payer: Worker's Compensation | Attending: Occupational Medicine | Admitting: *Deleted

## 2012-12-05 DIAGNOSIS — M25569 Pain in unspecified knee: Secondary | ICD-10-CM | POA: Insufficient documentation

## 2012-12-05 DIAGNOSIS — Z96659 Presence of unspecified artificial knee joint: Secondary | ICD-10-CM | POA: Insufficient documentation

## 2012-12-05 DIAGNOSIS — R5381 Other malaise: Secondary | ICD-10-CM | POA: Insufficient documentation

## 2012-12-05 DIAGNOSIS — M25669 Stiffness of unspecified knee, not elsewhere classified: Secondary | ICD-10-CM | POA: Insufficient documentation

## 2012-12-05 DIAGNOSIS — IMO0001 Reserved for inherently not codable concepts without codable children: Secondary | ICD-10-CM | POA: Insufficient documentation

## 2012-12-07 ENCOUNTER — Ambulatory Visit: Payer: Worker's Compensation | Admitting: Physical Therapy

## 2012-12-11 ENCOUNTER — Ambulatory Visit: Payer: Worker's Compensation | Admitting: Physical Therapy

## 2012-12-13 ENCOUNTER — Ambulatory Visit: Payer: Worker's Compensation | Admitting: Physical Therapy

## 2013-01-23 ENCOUNTER — Ambulatory Visit (INDEPENDENT_AMBULATORY_CARE_PROVIDER_SITE_OTHER): Payer: BC Managed Care – PPO | Admitting: Family Medicine

## 2013-01-23 ENCOUNTER — Encounter: Payer: Self-pay | Admitting: Family Medicine

## 2013-01-23 VITALS — BP 109/78 | HR 99 | Temp 97.8°F | Ht 70.0 in | Wt 193.2 lb

## 2013-01-23 DIAGNOSIS — J069 Acute upper respiratory infection, unspecified: Secondary | ICD-10-CM

## 2013-01-23 MED ORDER — AZITHROMYCIN 250 MG PO TABS: ORAL_TABLET | ORAL | Status: DC

## 2013-01-23 NOTE — Patient Instructions (Signed)

## 2013-01-23 NOTE — Progress Notes (Signed)
   Subjective:    Patient ID: Courtney Brooks, female    DOB: 11/16/1970, 43 y.o.   MRN: [id number]  HPI This 43 y.o. female presents for evaluation of URI sx's for a week.   Review of Systems C/o uri sx's No chest pain, SOB, HA, dizziness, vision change, N/V, diarrhea, constipation, dysuria, urinary urgency or frequency, myalgias, arthralgias or rash.     Objective:   Physical Exam  Vital signs noted  Well developed well nourished female.  HEENT - Head atraumatic Normocephalic                Eyes - PERRLA, Conjuctiva - clear Sclera- Clear EOMI                Ears - EAC's Wnl TM's Wnl Gross Hearing WNL                Nose - Nares patent                 Throat - oropharanx wnl Respiratory - Lungs CTA bilateral Cardiac - RRR S1 and S2 without murmur GI - Abdomen soft Nontender and bowel sounds active x 4 Extremities - No edema. Neuro - Grossly intact.      Assessment & Plan:  URI (upper respiratory infection) - Plan: azithromycin (ZITHROMAX) 250 MG tablet Push po fluids, rest, tylenol and motrin otc prn as directed for fever, arthralgias, and myalgias.  Follow up prn if sx's continue or persist.  Deatra CanterWilliam J Shakeem Stern FNP

## 2013-03-26 ENCOUNTER — Ambulatory Visit: Payer: Worker's Compensation | Attending: Specialist | Admitting: Physical Therapy

## 2013-03-26 DIAGNOSIS — IMO0001 Reserved for inherently not codable concepts without codable children: Secondary | ICD-10-CM | POA: Insufficient documentation

## 2013-03-26 DIAGNOSIS — R5381 Other malaise: Secondary | ICD-10-CM | POA: Insufficient documentation

## 2013-03-26 DIAGNOSIS — M25559 Pain in unspecified hip: Secondary | ICD-10-CM | POA: Insufficient documentation

## 2013-03-28 ENCOUNTER — Ambulatory Visit (INDEPENDENT_AMBULATORY_CARE_PROVIDER_SITE_OTHER): Payer: BC Managed Care – PPO | Admitting: General Practice

## 2013-03-28 ENCOUNTER — Encounter: Payer: Self-pay | Admitting: General Practice

## 2013-03-28 ENCOUNTER — Telehealth: Payer: Self-pay | Admitting: Family Medicine

## 2013-03-28 VITALS — BP 118/86 | HR 96 | Temp 97.7°F | Ht 69.0 in | Wt 200.6 lb

## 2013-03-28 DIAGNOSIS — J019 Acute sinusitis, unspecified: Secondary | ICD-10-CM

## 2013-03-28 MED ORDER — AZITHROMYCIN 250 MG PO TABS: ORAL_TABLET | ORAL | Status: DC

## 2013-03-28 NOTE — Patient Instructions (Signed)

## 2013-03-28 NOTE — Telephone Encounter (Signed)
APPT MADE

## 2013-03-29 ENCOUNTER — Ambulatory Visit: Payer: Worker's Compensation | Admitting: Physical Therapy

## 2013-04-02 NOTE — Progress Notes (Signed)
   Subjective:    Patient ID: Courtney Brooks, female    DOB: 09/01/1970, 43 y.o.   MRN: [id number]  Sinusitis This is a new problem. The current episode started in the past 7 days. The problem has been gradually worsening since onset. There has been no fever. Associated symptoms include congestion and sinus pressure. Pertinent negatives include no chills, coughing, headaches or shortness of breath. Past treatments include oral decongestants. The treatment provided mild relief.      Review of Systems  Constitutional: Negative for fever and chills.  HENT: Positive for congestion and sinus pressure.   Respiratory: Negative for cough, chest tightness and shortness of breath.   Cardiovascular: Negative for chest pain and palpitations.  Neurological: Negative for dizziness, weakness and headaches.       Objective:   Physical Exam  Constitutional: She appears well-developed and well-nourished.  HENT:  Head: Normocephalic and atraumatic.  Nose: Right sinus exhibits maxillary sinus tenderness and frontal sinus tenderness. Left sinus exhibits maxillary sinus tenderness and frontal sinus tenderness.  Mouth/Throat: Posterior oropharyngeal erythema present.  Cardiovascular: Normal rate, regular rhythm and normal heart sounds.   No murmur heard. Pulmonary/Chest: Effort normal and breath sounds normal. No respiratory distress. She exhibits no tenderness.  Neurological: She is alert.  Skin: Skin is warm and dry. No rash noted.  Psychiatric: She has a normal mood and affect.          Assessment & Plan:  1. Sinusitis, acute - azithromycin (ZITHROMAX) 250 MG tablet; Take as directed  Dispense: 6 tablet; Refill: 0 -RTO if symptoms worsen or unresolved -Patient verbalized understanding Coralie KeensMae E. Dvaughn Fickle, FNP-C

## 2013-04-03 ENCOUNTER — Ambulatory Visit: Payer: Worker's Compensation | Admitting: *Deleted

## 2013-04-05 ENCOUNTER — Ambulatory Visit: Payer: Worker's Compensation | Attending: Specialist | Admitting: *Deleted

## 2013-04-05 DIAGNOSIS — IMO0001 Reserved for inherently not codable concepts without codable children: Secondary | ICD-10-CM | POA: Insufficient documentation

## 2013-04-05 DIAGNOSIS — R5381 Other malaise: Secondary | ICD-10-CM | POA: Insufficient documentation

## 2013-04-05 DIAGNOSIS — M25559 Pain in unspecified hip: Secondary | ICD-10-CM | POA: Insufficient documentation

## 2013-04-10 ENCOUNTER — Ambulatory Visit: Payer: Worker's Compensation

## 2013-04-12 ENCOUNTER — Ambulatory Visit: Payer: Worker's Compensation

## 2013-04-13 ENCOUNTER — Ambulatory Visit (INDEPENDENT_AMBULATORY_CARE_PROVIDER_SITE_OTHER): Payer: BC Managed Care – PPO | Admitting: Family Medicine

## 2013-04-13 ENCOUNTER — Encounter: Payer: Self-pay | Admitting: Family Medicine

## 2013-04-13 VITALS — BP 124/83 | HR 101 | Temp 98.8°F | Ht 69.0 in | Wt 203.6 lb

## 2013-04-13 DIAGNOSIS — J329 Chronic sinusitis, unspecified: Secondary | ICD-10-CM

## 2013-04-13 MED ORDER — FLUCONAZOLE 150 MG PO TABS: 150.0000 mg | ORAL_TABLET | Freq: Once | ORAL | Status: DC

## 2013-04-13 MED ORDER — AMOXICILLIN 875 MG PO TABS: 875.0000 mg | ORAL_TABLET | Freq: Two times a day (BID) | ORAL | Status: DC

## 2013-04-13 NOTE — Progress Notes (Signed)
   Subjective:    Patient ID: Courtney Brooks, female    DOB: 08/09/1970, 43 y.o.   MRN: [id number]  HPI  This 43 y.o. female presents for evaluation of sinus pressure and facial pain for a week.  Review of Systems No chest pain, SOB, HA, dizziness, vision change, N/V, diarrhea, constipation, dysuria, urinary urgency or frequency, myalgias, arthralgias or rash.     Objective:   Physical Exam  Vital signs noted  Well developed well nourished female.  HEENT - Head atraumatic Normocephalic                Eyes - PERRLA, Conjuctiva - clear Sclera- Clear EOMI                Ears - EAC's Wnl TM's Wnl Gross Hearing WNL                Throat - oropharanx wnl Respiratory - Lungs CTA bilateral Cardiac - RRR S1 and S2 without murmur GI - Abdomen soft Nontender and bowel sounds active x 4 Extremities - No edema. Neuro - Grossly intact.      Assessment & Plan:  Sinusitis - Plan: amoxicillin (AMOXIL) 875 MG tablet, fluconazole (DIFLUCAN) 150 MG tablet Push po fluids, rest, tylenol and motrin otc prn as directed for fever, arthralgias, and myalgias.  Follow up prn if sx's continue or persist.  Deatra CanterWilliam J Tyanna Hach FNP

## 2013-04-17 ENCOUNTER — Ambulatory Visit: Payer: Worker's Compensation | Admitting: Physical Therapy

## 2013-04-19 ENCOUNTER — Ambulatory Visit: Payer: Worker's Compensation | Admitting: Physical Therapy

## 2013-04-23 ENCOUNTER — Ambulatory Visit: Payer: Worker's Compensation | Admitting: *Deleted

## 2013-04-25 ENCOUNTER — Ambulatory Visit: Payer: Worker's Compensation | Admitting: Physical Therapy

## 2013-05-01 ENCOUNTER — Ambulatory Visit: Payer: Worker's Compensation | Admitting: *Deleted

## 2013-05-03 ENCOUNTER — Ambulatory Visit: Payer: Worker's Compensation | Admitting: *Deleted

## 2013-05-21 ENCOUNTER — Ambulatory Visit: Payer: Worker's Compensation | Attending: Specialist | Admitting: Physical Therapy

## 2013-05-21 DIAGNOSIS — IMO0001 Reserved for inherently not codable concepts without codable children: Secondary | ICD-10-CM | POA: Insufficient documentation

## 2013-05-21 DIAGNOSIS — M25559 Pain in unspecified hip: Secondary | ICD-10-CM | POA: Insufficient documentation

## 2013-05-21 DIAGNOSIS — R5381 Other malaise: Secondary | ICD-10-CM | POA: Insufficient documentation

## 2013-05-23 ENCOUNTER — Ambulatory Visit: Payer: Worker's Compensation | Admitting: Physical Therapy

## 2013-05-29 ENCOUNTER — Ambulatory Visit: Payer: Worker's Compensation | Admitting: Physical Therapy

## 2013-05-31 ENCOUNTER — Ambulatory Visit: Payer: Worker's Compensation | Admitting: Physical Therapy

## 2013-06-04 ENCOUNTER — Ambulatory Visit: Payer: Worker's Compensation | Attending: Specialist | Admitting: Physical Therapy

## 2013-06-04 DIAGNOSIS — M25559 Pain in unspecified hip: Secondary | ICD-10-CM | POA: Insufficient documentation

## 2013-06-04 DIAGNOSIS — R5381 Other malaise: Secondary | ICD-10-CM | POA: Insufficient documentation

## 2013-06-04 DIAGNOSIS — IMO0001 Reserved for inherently not codable concepts without codable children: Secondary | ICD-10-CM | POA: Insufficient documentation

## 2013-06-06 ENCOUNTER — Ambulatory Visit: Payer: Worker's Compensation | Admitting: Physical Therapy

## 2013-06-12 ENCOUNTER — Ambulatory Visit: Payer: Worker's Compensation | Admitting: Physical Therapy

## 2013-06-14 ENCOUNTER — Ambulatory Visit: Payer: Worker's Compensation | Admitting: Physical Therapy

## 2013-06-18 ENCOUNTER — Ambulatory Visit: Payer: Worker's Compensation | Admitting: *Deleted

## 2013-06-20 ENCOUNTER — Ambulatory Visit: Payer: Worker's Compensation | Admitting: Physical Therapy

## 2013-07-02 ENCOUNTER — Ambulatory Visit: Payer: Worker's Compensation | Admitting: *Deleted

## 2013-07-04 ENCOUNTER — Ambulatory Visit: Payer: Worker's Compensation | Admitting: Physical Therapy

## 2013-07-09 ENCOUNTER — Encounter: Payer: Self-pay | Admitting: Physical Therapy

## 2014-01-31 ENCOUNTER — Encounter: Payer: Self-pay | Admitting: Family Medicine

## 2014-01-31 ENCOUNTER — Ambulatory Visit (INDEPENDENT_AMBULATORY_CARE_PROVIDER_SITE_OTHER): Payer: BLUE CROSS/BLUE SHIELD | Admitting: Family Medicine

## 2014-01-31 VITALS — BP 121/81 | HR 88 | Temp 97.3°F | Ht 69.0 in | Wt 207.0 lb

## 2014-01-31 DIAGNOSIS — J0100 Acute maxillary sinusitis, unspecified: Secondary | ICD-10-CM

## 2014-01-31 MED ORDER — METHYLPREDNISOLONE (PAK) 4 MG PO TABS: ORAL_TABLET | ORAL | Status: DC

## 2014-01-31 MED ORDER — AMOXICILLIN 875 MG PO TABS: 875.0000 mg | ORAL_TABLET | Freq: Two times a day (BID) | ORAL | Status: DC

## 2014-01-31 NOTE — Progress Notes (Signed)
   Subjective:    Patient ID: Courtney Brooks, female    DOB: 03/09/1970, 44 y.o.   MRN: [id number]  HPI Patient c/o maxillary facial discomfort and uri sx's.  Review of Systems  Constitutional: Negative for fever.  HENT: Negative for ear pain.   Eyes: Negative for discharge.  Respiratory: Negative for cough.   Cardiovascular: Negative for chest pain.  Gastrointestinal: Negative for abdominal distention.  Endocrine: Negative for polyuria.  Genitourinary: Negative for difficulty urinating.  Musculoskeletal: Negative for gait problem and neck pain.  Skin: Negative for color change and rash.  Neurological: Negative for speech difficulty and headaches.  Psychiatric/Behavioral: Negative for agitation.       Objective:    BP 121/81 mmHg  Pulse 88  Temp(Src) 97.3 F (36.3 C) (Oral)  Ht 5\' 9"  (1.753 m)  Wt 207 lb (93.895 kg)  BMI 30.55 kg/m2 Physical Exam  Constitutional: She is oriented to person, place, and time. She appears well-developed and well-nourished.  HENT:  Head: Normocephalic and atraumatic.  Mouth/Throat: Oropharynx is clear and moist.  Eyes: Pupils are equal, round, and reactive to light.  Neck: Normal range of motion. Neck supple.  Cardiovascular: Normal rate and regular rhythm.   No murmur heard. Pulmonary/Chest: Effort normal and breath sounds normal.  Abdominal: Soft. Bowel sounds are normal. There is no tenderness.  Neurological: She is alert and oriented to person, place, and time.  Skin: Skin is warm and dry.  Psychiatric: She has a normal mood and affect.          Assessment & Plan:     ICD-9-CM ICD-10-CM   1. Subacute maxillary sinusitis 461.0 J01.00 amoxicillin (AMOXIL) 875 MG tablet     methylPREDNIsolone (MEDROL DOSPACK) 4 MG tablet     No Follow-up on file.  Deatra CanterWilliam J Ileigh Mettler FNP

## 2014-04-28 NOTE — Op Note (Signed)
PATIENT NAME:  Job FoundsSMITH, Courtney Brooks DATE OF BIRTH:  August 25, 1970  DATE OF PROCEDURE:  03/25/2011  PREOPERATIVE DIAGNOSIS: Menorrhagia.   POSTOPERATIVE DIAGNOSIS: Menorrhagia.  PROCEDURES PERFORMED: Dilatation and curettage, hysteroscopy, and NovaSure endometrial ablation.   SURGEON: Florina Oundreas M. Bonney AidStaebler, MD  ASSISTANT: None.   ANESTHESIA: General.   ESTIMATED BLOOD LOSS: Minimal.   IV FLUIDS: 600 mL of crystalloid.   URINE OUTPUT: 200 mL of clear urine straight catheter at the beginning of the case.   COMPLICATIONS: None.   SPECIMENS REMOVED: Endometrial curettings.   INTRAOPERATIVE FINDINGS: Preablation hysteroscopy revealed a normal endocervical canal and uterine cavity. Cervical length measured 3.5 cm and uterine length 8 cm for total cavity length of 4.5 cm. The cavity width was found to be 4.5 cm. Power was 111 and burn time was 1 minute 55 seconds. Postprocedure hysteroscopy revealed good coverage of the endometrial cavity by the ablation and only showing small area of pink endometrium at the fundus.   CONDITION FOLLOWING PROCEDURE: The patient's condition following the procedure was stable.   DESCRIPTION OF PROCEDURE: The risks, benefits, and alternatives of the procedure were discussed with the patient prior to proceeding to the OR. The patient was placed under general endotracheal anesthesia. She was positioned in the dorsal lithotomy position using candy-cane stirrups. The patient was then prepped and draped in the usual sterile fashion. The bladder was drained for 200 mL of clear urine. A sterile speculum was placed. The cervix was visualized and grasped with a single-tooth tenaculum. The cervix was then serially dilated using Hegar dilators. Following dilation the cervical length and cavity measurements were obtained, as noted above. Hysteroscopy was performed noting the above findings. Following hysteroscopy NovaSure ablation was conducted with the above noted settings.  A cavity assessment was done and passed. Following the ablation, a second-look hysteroscopy was done noting good coverage of the endometrial cavity by the ablation with only a small area of pink endometrium at the fundus. The tenaculum was removed. The cervix was noted to be hemostatic. The speculum was removed at this time, and the patient was taken to the Recovery Room in stable condition. Sponge, needle, and instrument counts were correct x2.  ____________________________ Florina OuAndreas M. Bonney AidStaebler, MD ams:slb D: 03/25/2011 13:04:59 ET T: 03/25/2011 13:16:25 ET JOB#: [id number]  cc: Florina OuAndreas M. Bonney AidStaebler, MD, <Dictator> Lorrene ReidANDREAS M Javon Hupfer MD ELECTRONICALLY SIGNED 04/06/2011 22:04

## 2014-08-08 ENCOUNTER — Ambulatory Visit (INDEPENDENT_AMBULATORY_CARE_PROVIDER_SITE_OTHER): Payer: BLUE CROSS/BLUE SHIELD | Admitting: Physician Assistant

## 2014-08-08 ENCOUNTER — Encounter: Payer: Self-pay | Admitting: Physician Assistant

## 2014-08-08 VITALS — BP 141/94 | HR 129 | Temp 97.6°F | Ht 69.0 in | Wt 198.0 lb

## 2014-08-08 DIAGNOSIS — M549 Dorsalgia, unspecified: Secondary | ICD-10-CM | POA: Diagnosis not present

## 2014-08-08 DIAGNOSIS — F411 Generalized anxiety disorder: Secondary | ICD-10-CM

## 2014-08-08 DIAGNOSIS — F329 Major depressive disorder, single episode, unspecified: Secondary | ICD-10-CM

## 2014-08-08 DIAGNOSIS — F32A Depression, unspecified: Secondary | ICD-10-CM

## 2014-08-08 MED ORDER — ALPRAZOLAM 0.5 MG PO TABS: 0.5000 mg | ORAL_TABLET | Freq: Three times a day (TID) | ORAL | Status: DC | PRN

## 2014-08-08 MED ORDER — DULOXETINE HCL 30 MG PO CPEP: 30.0000 mg | ORAL_CAPSULE | Freq: Every day | ORAL | Status: DC

## 2014-08-08 NOTE — Progress Notes (Signed)
   Subjective:    Patient ID: Courtney Brooks, female    DOB: May 19, 1970, 44 y.o.   MRN: [id number]  HPI  44 y/o female presetns with c/o increased anxiety. She has had back surgery x 2 years and was recently told that this would be chronic so she is being forced to resign from her job since she cannot fulfill her duties. She has became very depressed since realizing this. She states that she "has no life and is living to work"   Has taken xanax in the past when her grandmother passed away.    Review of Systems  Constitutional: Negative.   HENT: Negative.   Musculoskeletal: Positive for back pain.  Psychiatric/Behavioral: Positive for sleep disturbance (3-4 hours / night ) and dysphoric mood. Negative for suicidal ideas. The patient is nervous/anxious.        Objective:   Physical Exam  Constitutional: She is oriented to person, place, and time. She appears well-developed and well-nourished.  Cardiovascular:  Hypertensive , tachycardic   Neurological: She is alert and oriented to person, place, and time.  Psychiatric:  Crying during visit, appears very sad  Nursing note and vitals reviewed.         Assessment & Plan:  1. Generalized anxiety disorder  - DULoxetine (CYMBALTA) 30 MG capsule; Take 1 capsule (30 mg total) by mouth daily. Take 1 capsule PO q day x 7 days. Then take 2 capsules daily  Dispense: 60 capsule; Refill: 1 - ALPRAZolam (XANAX) 0.5 MG tablet; Take 1 tablet (0.5 mg total) by mouth 3 (three) times daily as needed for anxiety.  Dispense: 90 tablet; Refill: 2  2. Depression  - DULoxetine (CYMBALTA) 30 MG capsule; Take 1 capsule (30 mg total) by mouth daily. Take 1 capsule PO q day x 7 days. Then take 2 capsules daily  Dispense: 60 capsule; Refill: 1  3. Bilateral back pain, unspecified location  - DULoxetine (CYMBALTA) 30 MG capsule; Take 1 capsule (30 mg total) by mouth daily. Take 1 capsule PO q day x 7 days. Then take 2 capsules daily  Dispense: 60 capsule;  Refill: 1   Continue all meds Labs pending Health Maintenance reviewed Diet and exercise encouraged RTO 4 - 6 weeks   [name] A. Chauncey Reading PA-C

## 2014-09-12 ENCOUNTER — Other Ambulatory Visit (HOSPITAL_COMMUNITY): Payer: Self-pay | Admitting: Neurosurgery

## 2014-09-12 ENCOUNTER — Other Ambulatory Visit: Payer: Self-pay | Admitting: Neurosurgery

## 2014-09-12 DIAGNOSIS — M5442 Lumbago with sciatica, left side: Secondary | ICD-10-CM

## 2014-09-18 ENCOUNTER — Ambulatory Visit (INDEPENDENT_AMBULATORY_CARE_PROVIDER_SITE_OTHER): Payer: Managed Care, Other (non HMO) | Admitting: Physician Assistant

## 2014-09-18 ENCOUNTER — Encounter: Payer: Self-pay | Admitting: Physician Assistant

## 2014-09-18 VITALS — BP 109/74 | HR 109 | Temp 98.0°F | Ht 69.0 in | Wt 198.0 lb

## 2014-09-18 DIAGNOSIS — F411 Generalized anxiety disorder: Secondary | ICD-10-CM

## 2014-09-18 DIAGNOSIS — F329 Major depressive disorder, single episode, unspecified: Secondary | ICD-10-CM | POA: Diagnosis not present

## 2014-09-18 DIAGNOSIS — F32A Depression, unspecified: Secondary | ICD-10-CM

## 2014-09-18 MED ORDER — DULOXETINE HCL 60 MG PO CPEP: 60.0000 mg | ORAL_CAPSULE | Freq: Every day | ORAL | Status: DC

## 2014-09-18 NOTE — Progress Notes (Signed)
   Subjective:    Patient ID: Courtney Brooks, female    DOB: [birth year], 44 y.o.   MRN: [id number]  HPI 44 y/o female presents for follow up after recently starting Cymbalta  q day. She feels that her mood is improving and the medication is working.   She is scheduled to meet with a Neurosurgeon, which will hopefully help with her back issues and has been told by her job that she always has the option to return to work so she is in much better spirits than at her last visit.    Review of Systems  Constitutional: Negative.   HENT: Negative.   Eyes: Negative.   Respiratory: Negative.   Cardiovascular: Negative.   Gastrointestinal: Negative.   Musculoskeletal: Positive for back pain.  Psychiatric/Behavioral: The patient is nervous/anxious.        Objective:   Physical Exam  Constitutional: She is oriented to person, place, and time. She appears well-developed and well-nourished. No distress.  HENT:  Head: Normocephalic.  Musculoskeletal: She exhibits no edema or tenderness.  Neurological: She is alert and oriented to person, place, and time.  Skin: She is not diaphoretic.  Psychiatric: She has a normal mood and affect. Her behavior is normal. Judgment and thought content normal.  Nursing note and vitals reviewed.         Assessment & Plan:  1. Depression  - DULoxetine (CYMBALTA) 60 MG capsule; Take 1 capsule (60 mg total) by mouth daily.  Dispense: 30 capsule; Refill: 5  2. Generalized anxiety disorder   - DULoxetine (CYMBALTA) 60 MG capsule; Take 1 capsule (60 mg total) by mouth daily.  Dispense: 30 capsule; Refill: 5  I have also advised her to decrease taking the xanax to once daily, relying on the Cymbalta for anxiety. I have also advised her to call the office if she decides to discontinue the medication   RTC 6 months   Jahquez Steffler A. Chauncey Reading PA-C

## 2014-09-23 ENCOUNTER — Ambulatory Visit (HOSPITAL_COMMUNITY)
Admission: RE | Admit: 2014-09-23 | Discharge: 2014-09-23 | Disposition: A | Discharge status: Home / Self Care | Payer: Managed Care, Other (non HMO) | Source: Ambulatory Visit | Attending: Neurosurgery | Admitting: Neurosurgery

## 2014-09-23 ENCOUNTER — Other Ambulatory Visit (HOSPITAL_COMMUNITY): Payer: Self-pay

## 2014-09-23 DIAGNOSIS — M5442 Lumbago with sciatica, left side: Secondary | ICD-10-CM | POA: Insufficient documentation

## 2014-09-23 DIAGNOSIS — M47896 Other spondylosis, lumbar region: Secondary | ICD-10-CM | POA: Insufficient documentation

## 2014-09-23 DIAGNOSIS — M5126 Other intervertebral disc displacement, lumbar region: Secondary | ICD-10-CM | POA: Diagnosis not present

## 2014-09-23 MED ORDER — DIAZEPAM 5 MG PO TABS
10.0000 mg | ORAL_TABLET | Freq: Once | ORAL | Status: AC
  Administered 2014-09-23: 10 mg via ORAL

## 2014-09-23 MED ORDER — TRAMADOL HCL 50 MG PO TABS: 50.0000 mg | ORAL_TABLET | Freq: Four times a day (QID) | ORAL | Status: DC | PRN

## 2014-09-23 MED ORDER — HYDROCODONE-ACETAMINOPHEN 5-325 MG PO TABS
ORAL_TABLET | ORAL | Status: AC
  Filled 2014-09-23: qty 2

## 2014-09-23 MED ORDER — LIDOCAINE HCL (PF) 1 % IJ SOLN
INTRAMUSCULAR | Status: AC
  Filled 2014-09-23: qty 10

## 2014-09-23 MED ORDER — IOHEXOL 180 MG/ML  SOLN
20.0000 mL | Freq: Once | INTRAMUSCULAR | Status: DC | PRN
  Administered 2014-09-23: 16 mL via INTRATHECAL
  Filled 2014-09-23: qty 20

## 2014-09-23 MED ORDER — ONDANSETRON HCL 4 MG/2ML IJ SOLN: 4.0000 mg | Freq: Four times a day (QID) | INTRAMUSCULAR | Status: DC | PRN

## 2014-09-23 MED ORDER — DIAZEPAM 5 MG PO TABS
ORAL_TABLET | ORAL | Status: AC
  Filled 2014-09-23: qty 2

## 2014-09-23 MED ORDER — HYDROCODONE-ACETAMINOPHEN 5-325 MG PO TABS
1.0000 | ORAL_TABLET | ORAL | Status: DC | PRN
  Administered 2014-09-23: 2 via ORAL

## 2014-09-23 NOTE — Op Note (Signed)
09/23/2014 lumbar Myelogram  PATIENT:  Courtney Brooks is a 44 y.o. female low back pain and radicular pain  PRE-OPERATIVE DIAGNOSIS:  Osteoarthritis with radiculopathy  POST-OPERATIVE DIAGNOSIS:  same  PROCEDURE:  Lumbar Myelogram  SURGEON:  Cabbell  ANESTHESIA:   local LOCAL MEDICATIONS USED:  LIDOCAINE  and Amount: 5 ml Procedure Note: Courtney Brooks is a 44 y.o. female whom Was taken to the fluoroscopy suite and  positioned prone on the fluoroscopy table. Her back was prepared and draped in a sterile manner. I infiltrated 5 cc into the lumbar region. I then introduced a spinal needle into the thecal sac at the L3/4  interlaminar space. I infiltrated 20cc of Omnipaque 180 into the thecal sac. Fluoroscopy showed the needle and contrast in the thecal sac. Courtney Brooks tolerated the procedure well. she Will be taken to CT for evaluation.     PATIENT DISPOSITION:  PACU - hemodynamically stable.

## 2014-09-23 NOTE — Discharge Instructions (Signed)
Myelogram and Lumbar Puncture Discharge Instructions ° °1. Go home and rest quietly for the next 24 hours.  It is important to lie flat for the next 24 hours.  Get up only to go to the restroom.  You may lie in the bed or on a couch on your back, your stomach, your left side or your right side.  You may have one pillow under your head.  You may have pillows between your knees while you are on your side or under your knees while you are on your back. ° °2. DO NOT drive today.  Recline the seat as far back as it will go, while still wearing your seat belt, on the way home. ° °3. You may get up to go to the bathroom as needed.  You may sit up for 10 minutes to eat.  You may resume your normal diet and medications unless otherwise indicated. ° °4. The incidence of headache, nausea, or vomiting is about 5% (one in 20 patients).  If you develop a headache, lie flat and drink plenty of fluids until the headache goes away.  Caffeinated beverages may be helpful.  If you develop severe nausea and vomiting or a headache that does not go away with flat bed rest, call the office. ° °5. You may resume normal activities after your 24 hours of bed rest is over; however, do not exert yourself strongly or do any heavy lifting tomorrow. ° °6. Call your physician for a follow-up appointment.  The results of your myelogram will be sent directly to your physician by the following day. ° °7. If you have any questions or if complications develop after you arrive home, please call the office. ° °Discharge instructions have been explained to the patient.  The patient, or the person responsible for the patient, fully understands these instructions. ° ° °

## 2014-09-23 NOTE — Progress Notes (Signed)
C/o pain meds given per order rates 9/10

## 2014-10-05 HISTORY — PX: BACK SURGERY: SHX140

## 2014-12-26 ENCOUNTER — Other Ambulatory Visit: Payer: Self-pay | Admitting: Neurosurgery

## 2014-12-26 DIAGNOSIS — G9782 Other postprocedural complications and disorders of nervous system: Secondary | ICD-10-CM

## 2015-01-08 ENCOUNTER — Ambulatory Visit
Admission: RE | Admit: 2015-01-08 | Discharge: 2015-01-08 | Disposition: A | Discharge status: Home / Self Care | Payer: Managed Care, Other (non HMO) | Source: Ambulatory Visit | Attending: Neurosurgery | Admitting: Neurosurgery

## 2015-01-08 DIAGNOSIS — G9782 Other postprocedural complications and disorders of nervous system: Secondary | ICD-10-CM

## 2015-02-03 ENCOUNTER — Other Ambulatory Visit: Payer: Self-pay | Admitting: Physician Assistant

## 2015-02-04 NOTE — Telephone Encounter (Signed)
Pt notified she will NTBS for further refill Will call back to schedule appt

## 2015-02-04 NOTE — Telephone Encounter (Signed)
Last seen 09/18/14  Courtney Brooks  If approved route to nurse to call into [facility]

## 2015-05-19 ENCOUNTER — Other Ambulatory Visit: Payer: Self-pay | Admitting: Neurosurgery

## 2015-05-19 ENCOUNTER — Other Ambulatory Visit (HOSPITAL_COMMUNITY): Payer: Self-pay | Admitting: Neurosurgery

## 2015-05-19 DIAGNOSIS — M5126 Other intervertebral disc displacement, lumbar region: Secondary | ICD-10-CM

## 2015-05-22 ENCOUNTER — Ambulatory Visit (HOSPITAL_COMMUNITY)
Admission: RE | Admit: 2015-05-22 | Discharge: 2015-05-22 | Disposition: A | Discharge status: Home / Self Care | Payer: Managed Care, Other (non HMO) | Source: Ambulatory Visit | Attending: Neurosurgery | Admitting: Neurosurgery

## 2015-05-22 DIAGNOSIS — M5136 Other intervertebral disc degeneration, lumbar region: Secondary | ICD-10-CM | POA: Diagnosis not present

## 2015-05-22 DIAGNOSIS — M5126 Other intervertebral disc displacement, lumbar region: Secondary | ICD-10-CM

## 2015-05-22 MED ORDER — LIDOCAINE HCL (PF) 1 % IJ SOLN
INTRAMUSCULAR | Status: AC
  Administered 2015-05-22: 5 mL via INTRAMUSCULAR
  Filled 2015-05-22: qty 5

## 2015-05-22 MED ORDER — DIAZEPAM 5 MG PO TABS
ORAL_TABLET | ORAL | Status: AC
Start: 2015-05-22 — End: 2015-05-22
  Administered 2015-05-22: 10 mg via ORAL
  Filled 2015-05-22: qty 2

## 2015-05-22 MED ORDER — DIAZEPAM 5 MG PO TABS
10.0000 mg | ORAL_TABLET | Freq: Once | ORAL | Status: AC
  Administered 2015-05-22: 10 mg via ORAL
  Filled 2015-05-22: qty 2

## 2015-05-22 MED ORDER — HYDROCODONE-ACETAMINOPHEN 5-325 MG PO TABS
ORAL_TABLET | ORAL | Status: AC
  Filled 2015-05-22: qty 1

## 2015-05-22 MED ORDER — IOHEXOL 180 MG/ML  SOLN
20.0000 mL | Freq: Once | INTRAMUSCULAR | Status: AC | PRN
  Administered 2015-05-22: 20 mL via INTRATHECAL

## 2015-05-22 MED ORDER — HYDROCODONE-ACETAMINOPHEN 5-325 MG PO TABS
1.0000 | ORAL_TABLET | ORAL | Status: DC | PRN
  Administered 2015-05-22: 1 via ORAL

## 2015-05-22 MED ORDER — ONDANSETRON HCL 4 MG/2ML IJ SOLN: 4.0000 mg | Freq: Four times a day (QID) | INTRAMUSCULAR | Status: DC | PRN

## 2015-05-22 MED ORDER — OXYCODONE HCL 5 MG PO TABS: 5.0000 mg | ORAL_TABLET | ORAL | Status: DC | PRN

## 2015-05-22 NOTE — Op Note (Signed)
05/22/2015 lumbar Myelogram  PATIENT:  Courtney Brooks is a 45 y.o. female with pain in the lower back. She is admitted for a lumbar myelogram  PRE-OPERATIVE DIAGNOSIS:  Lumbago  POST-OPERATIVE DIAGNOSIS:  LumbAgo  PROCEDURE:  Lumbar Myelogram  SURGEON:  Johanne Mcglade  ANESTHESIA:   local LOCAL MEDICATIONS USED:  LIDOCAINE  and Amount: 8 ml Procedure Note: Courtney Brooks is a 45 y.o. female Was taken to the fluoroscopy suite and  positioned prone on the fluoroscopy table. Her back was prepared and draped in a sterile manner. I infiltrated 8 cc into the lumbar region. I then introduced a spinal needle into the thecal sac at the L4/5 interlaminar space. I infiltrated 20cc of Omnipaque 180 into the thecal sac. Fluoroscopy showed the needle and contrast in the thecal sac. Courtney Brooks tolerated the procedure well. she Will be taken to CT for evaluation.     PATIENT DISPOSITION:  PACU - hemodynamically stable.

## 2015-05-22 NOTE — Discharge Instructions (Signed)
Myelography, Care After °These instructions give you information on caring for yourself after your procedure. Your doctor may also give you more specific instructions. Call your doctor if you have any problems or questions after your procedure. °HOME CARE °· Rest the first day. °· When you rest, lie flat, with your head slightly raised (elevated). °· Avoid heavy lifting and activity for 48 hours, or as told by your doctor. °· You may take the bandage (dressing) off one day after the test, or as told by your doctor. °· Take all medicines only as told by your doctor. °· Ask your doctor when it is okay to take a shower or bath. °· Ask your doctor when your test results will be ready and how you can get them. Make sure you follow up and get your results. °· Do not drink alcohol for 24 hours, or as told by your doctor. °· Drink enough fluid to keep your pee (urine) clear or pale yellow. °GET HELP IF:  °· You have a fever. °· You have a headache. °· You feel sick to your stomach (nauseous) or throw up (vomit). °· You have pain or cramping in your belly (abdomen). °GET HELP RIGHT AWAY IF:  °· You have a headache with a stiff neck or fever. °· You have trouble breathing. °· Any of the places where the needles were put in are: °¨ Puffy (swollen) or red. °¨ Sore or hot to the touch. °¨ Draining yellowish-white fluid (pus). °¨ Bleeding. °MAKE SURE YOU: °· Understand these instructions. °· Will watch your condition. °· Will get help right away if you are not doing well or get worse. °  °This information is not intended to replace advice given to you by your health care provider. Make sure you discuss any questions you have with your health care provider. °  °Document Released: 09/30/2007 Document Revised: 01/11/2014 Document Reviewed: 09/15/2011 °Elsevier Interactive Patient Education ©2016 [employer] ° °

## 2015-06-12 LAB — HM PAP SMEAR: HM Pap smear: NEGATIVE

## 2015-07-11 DIAGNOSIS — N63 Unspecified lump in breast: Secondary | ICD-10-CM | POA: Diagnosis not present

## 2015-07-22 DIAGNOSIS — N63 Unspecified lump in breast: Secondary | ICD-10-CM | POA: Diagnosis not present

## 2015-07-22 DIAGNOSIS — D242 Benign neoplasm of left breast: Secondary | ICD-10-CM | POA: Diagnosis not present

## 2015-07-22 DIAGNOSIS — Z6828 Body mass index (BMI) 28.0-28.9, adult: Secondary | ICD-10-CM | POA: Diagnosis not present

## 2015-08-06 ENCOUNTER — Ambulatory Visit (INDEPENDENT_AMBULATORY_CARE_PROVIDER_SITE_OTHER): Payer: BLUE CROSS/BLUE SHIELD | Admitting: Family Medicine

## 2015-08-06 ENCOUNTER — Encounter: Payer: Self-pay | Admitting: Family Medicine

## 2015-08-06 VITALS — BP 119/83 | HR 105 | Temp 98.0°F | Ht 70.0 in | Wt 202.0 lb

## 2015-08-06 DIAGNOSIS — N390 Urinary tract infection, site not specified: Secondary | ICD-10-CM | POA: Diagnosis not present

## 2015-08-06 LAB — URINALYSIS, COMPLETE
Bilirubin, UA: NEGATIVE
GLUCOSE, UA: NEGATIVE
Ketones, UA: NEGATIVE
NITRITE UA: POSITIVE — AB
PH UA: 6.5 (ref 5.0–7.5)
Protein, UA: NEGATIVE
RBC, UA: NEGATIVE
Specific Gravity, UA: 1.01 (ref 1.005–1.030)
UUROB: 0.2 mg/dL (ref 0.2–1.0)

## 2015-08-06 LAB — MICROSCOPIC EXAMINATION

## 2015-08-06 MED ORDER — SULFAMETHOXAZOLE-TRIMETHOPRIM 800-160 MG PO TABS: 1.0000 | ORAL_TABLET | Freq: Two times a day (BID) | ORAL | 0 refills | Status: DC

## 2015-08-06 NOTE — Progress Notes (Signed)
BP 119/83 (BP Location: Left Arm, Patient Position: Sitting, Cuff Size: Large)   Pulse (!) 105   Temp 98 F (36.7 C) (Oral)   Ht 5\' 10"  (1.778 m)   Wt 202 lb (91.6 kg)   LMP 05/28/2011   BMI 28.98 kg/m    Subjective:    Patient ID: Courtney Brooks, female    DOB: [birth year], 45 y.o.   MRN: [id number]  HPI: Courtney Brooks is a 45 y.o. female presenting on 08/06/2015 for Urinary Tract Infection (urinary odor)   HPI Urinary odor Patient comes in because she's been having 2-3 days of increased urinary odor. She thinks it may be the early start of a UTI. When she gets UTIs she usually has the urinary order plus some fevers and chills and maybe even some flank pain. She is not having any of the dysuria or abdominal pain or flank pain or fevers or chills currently but she was hoping to catch it before it got to that extent. She has tried to increase her fluid intake.  Relevant past medical, surgical, family and social history reviewed and updated as indicated. Interim medical history since our last visit reviewed. Allergies and medications reviewed and updated.  Review of Systems  Constitutional: Negative for chills and fever.  HENT: Negative for congestion, ear discharge and ear pain.   Eyes: Negative for redness and visual disturbance.  Respiratory: Negative for chest tightness and shortness of breath.   Cardiovascular: Negative for chest pain and leg swelling.  Genitourinary: Negative for decreased urine volume, difficulty urinating, dysuria, flank pain, frequency, hematuria, urgency, vaginal bleeding, vaginal discharge and vaginal pain.  Musculoskeletal: Negative for back pain and gait problem.  Skin: Negative for rash.  Neurological: Negative for light-headedness and headaches.  Psychiatric/Behavioral: Negative for agitation and behavioral problems.  All other systems reviewed and are negative.   Per HPI unless specifically indicated above     Medication List       Accurate as  of 08/06/15  4:04 PM. Always use your most recent med list.          sulfamethoxazole-trimethoprim 800-160 MG tablet Commonly known as:  BACTRIM DS Take 1 tablet by mouth 2 (two) times daily.          Objective:    BP 119/83 (BP Location: Left Arm, Patient Position: Sitting, Cuff Size: Large)   Pulse (!) 105   Temp 98 F (36.7 C) (Oral)   Ht 5\' 10"  (1.778 m)   Wt 202 lb (91.6 kg)   LMP 05/28/2011   BMI 28.98 kg/m   Wt Readings from Last 3 Encounters:  08/06/15 202 lb (91.6 kg)  05/22/15 195 lb (88.5 kg)  09/23/14 195 lb (88.5 kg)    Physical Exam  Constitutional: She is oriented to person, place, and time. She appears well-developed and well-nourished. No distress.  Eyes: Conjunctivae and EOM are normal. Pupils are equal, round, and reactive to light.  Neck: Neck supple. No thyromegaly present.  Cardiovascular: Normal rate, regular rhythm, normal heart sounds and intact distal pulses.   No murmur heard. Pulmonary/Chest: Effort normal and breath sounds normal. No respiratory distress. She has no wheezes.  Abdominal: Soft. Bowel sounds are normal. She exhibits no distension. There is tenderness (Mild suprapubic tenderness, no CVA tenderness). There is no rebound and no guarding.  Musculoskeletal: Normal range of motion. She exhibits no edema or tenderness.  Lymphadenopathy:    She has no cervical adenopathy.  Neurological: She is alert and  oriented to person, place, and time. Coordination normal.  Skin: Skin is warm and dry. No rash noted. She is not diaphoretic.  Psychiatric: She has a normal mood and affect. Her behavior is normal.  Nursing note and vitals reviewed.    Urinalysis: Nitrite positive and leukocyte positive, 11-30 WBCs and many bacteria.    Assessment & Plan:   Problem List Items Addressed This Visit    None    Visit Diagnoses    UTI (lower urinary tract infection)    -  Primary   Relevant Medications   sulfamethoxazole-trimethoprim (BACTRIM DS)  800-160 MG tablet   Other Relevant Orders   Urinalysis, Complete   Urine culture       Follow up plan: Return if symptoms worsen or fail to improve.  Counseling provided for all of the vaccine components Orders Placed This Encounter  Procedures  . Urine culture  . Urinalysis, Complete    Arville Care, MD [facility] 08/06/2015, 4:04 PM

## 2015-08-07 ENCOUNTER — Telehealth: Payer: Self-pay | Admitting: Family Medicine

## 2015-08-08 ENCOUNTER — Other Ambulatory Visit: Payer: Self-pay | Admitting: Family Medicine

## 2015-08-08 LAB — URINE CULTURE

## 2015-08-08 MED ORDER — CIPROFLOXACIN HCL 500 MG PO TABS: 500.0000 mg | ORAL_TABLET | Freq: Two times a day (BID) | ORAL | 0 refills | Status: DC

## 2015-08-08 NOTE — Progress Notes (Signed)
Pt aware to stop Bactrim and to go get Cipro and start taking.

## 2015-10-09 ENCOUNTER — Encounter: Payer: Self-pay | Admitting: Family

## 2015-10-09 ENCOUNTER — Ambulatory Visit (INDEPENDENT_AMBULATORY_CARE_PROVIDER_SITE_OTHER): Payer: BLUE CROSS/BLUE SHIELD | Admitting: Family

## 2015-10-09 VITALS — BP 129/91 | HR 88 | Temp 97.6°F | Ht 70.0 in | Wt 202.4 lb

## 2015-10-09 DIAGNOSIS — J069 Acute upper respiratory infection, unspecified: Secondary | ICD-10-CM | POA: Diagnosis not present

## 2015-10-09 MED ORDER — FLUTICASONE PROPIONATE 50 MCG/ACT NA SUSP: 2.0000 | Freq: Every day | NASAL | 6 refills | Status: DC

## 2015-10-09 MED ORDER — PREDNISONE 10 MG (21) PO TBPK: ORAL_TABLET | ORAL | 0 refills | Status: DC

## 2015-10-09 NOTE — Progress Notes (Signed)
   Subjective:    Patient ID: Courtney Brooks, female    DOB: 12/28/1970, 45 y.o.   MRN: [id number]  Cough  Associated symptoms include chills, ear pain, headaches and a sore throat.  Sinus Problem  This is a new problem. The current episode started in the past 7 days. The problem has been gradually worsening since onset. Her pain is at a severity of 2/10. Associated symptoms include chills, congestion, coughing, ear pain, headaches, a hoarse voice, sinus pressure and a sore throat. Pertinent negatives include no sneezing. Past treatments include acetaminophen and oral decongestants. The treatment provided mild relief.      Review of Systems  Constitutional: Positive for chills.  HENT: Positive for congestion, ear pain, hoarse voice, sinus pressure and sore throat. Negative for sneezing.   Respiratory: Positive for cough.   Neurological: Positive for headaches.  All other systems reviewed and are negative.      Objective:   Physical Exam  Constitutional: She is oriented to person, place, and time. She appears well-developed and well-nourished. No distress.  HENT:  Head: Normocephalic and atraumatic.  Right Ear: External ear normal.  Left Ear: External ear normal.  Nose: Mucosal edema and rhinorrhea present.  Mouth/Throat: Posterior oropharyngeal erythema present.  Eyes: Pupils are equal, round, and reactive to light.  Neck: Normal range of motion. Neck supple. No thyromegaly present.  Cardiovascular: Normal rate, regular rhythm, normal heart sounds and intact distal pulses.   No murmur heard. Pulmonary/Chest: Effort normal and breath sounds normal. No respiratory distress. She has no wheezes.  Abdominal: Soft. Bowel sounds are normal. She exhibits no distension. There is no tenderness.  Musculoskeletal: Normal range of motion. She exhibits no edema or tenderness.  Neurological: She is alert and oriented to person, place, and time. She has normal reflexes. No cranial nerve deficit.    Skin: Skin is warm and dry.  Psychiatric: She has a normal mood and affect. Her behavior is normal. Judgment and thought content normal.  Vitals reviewed.     BP (!) 129/91   Pulse 88   Temp 97.6 F (36.4 C) (Oral)   Ht 5\' 10"  (1.778 m)   Wt 202 lb 6.4 oz (91.8 kg)   LMP 05/28/2011   BMI 29.04 kg/m      Assessment & Plan:  1. Acute upper respiratory infection -- Take meds as prescribed - Use a cool mist humidifier  -Use saline nose sprays frequently -Saline irrigations of the nose can be very helpful if done frequently.  * 4X daily for 1 week*  * Use of a nettie pot can be helpful with this. Follow directions with this* -Force fluids -For any cough or congestion  Use plain Mucinex- regular strength or max strength is fine   * Children- consult with Pharmacist for dosing -For fever or aces or pains- take tylenol or ibuprofen appropriate for age and weight.  * for fevers greater than 101 orally you may alternate ibuprofen and tylenol every  3 hours. -Throat lozenges if help -New toothbrush in 3 days - predniSONE (STERAPRED UNI-PAK 21 TAB) 10 MG (21) TBPK tablet; Use as directed  Dispense: 21 tablet; Refill: 0 - fluticasone (FLONASE) 50 MCG/ACT nasal spray; Place 2 sprays into both nostrils daily.  Dispense: 16 g; Refill: 6  Jannifer Rodneyhristy Tayon Parekh, FNP

## 2015-10-09 NOTE — Patient Instructions (Signed)
Upper Respiratory Infection, Adult Most upper respiratory infections (URIs) are a viral infection of the air passages leading to the lungs. A URI affects the nose, throat, and upper air passages. The most common type of URI is nasopharyngitis and is typically referred to as "the common cold." URIs run their course and usually go away on their own. Most of the time, a URI does not require medical attention, but sometimes a bacterial infection in the upper airways can follow a viral infection. This is called a secondary infection. Sinus and middle ear infections are common types of secondary upper respiratory infections. Bacterial pneumonia can also complicate a URI. A URI can worsen asthma and chronic obstructive pulmonary disease (COPD). Sometimes, these complications can require emergency medical care and may be life threatening.  CAUSES Almost all URIs are caused by viruses. A virus is a type of germ and can spread from one person to another.  RISKS FACTORS You may be at risk for a URI if:   You smoke.   You have chronic heart or lung disease.  You have a weakened defense (immune) system.   You are very young or very old.   You have nasal allergies or asthma.  You work in crowded or poorly ventilated areas.  You work in health care facilities or schools. SIGNS AND SYMPTOMS  Symptoms typically develop 2-3 days after you come in contact with a cold virus. Most viral URIs last 7-10 days. However, viral URIs from the influenza virus (flu virus) can last 14-18 days and are typically more severe. Symptoms may include:   Runny or stuffy (congested) nose.   Sneezing.   Cough.   Sore throat.   Headache.   Fatigue.   Fever.   Loss of appetite.   Pain in your forehead, behind your eyes, and over your cheekbones (sinus pain).  Muscle aches.  DIAGNOSIS  Your health care provider may diagnose a URI by:  Physical exam.  Tests to check that your symptoms are not due to  another condition such as:  Strep throat.  Sinusitis.  Pneumonia.  Asthma. TREATMENT  A URI goes away on its own with time. It cannot be cured with medicines, but medicines may be prescribed or recommended to relieve symptoms. Medicines may help:  Reduce your fever.  Reduce your cough.  Relieve nasal congestion. HOME CARE INSTRUCTIONS   Take medicines only as directed by your health care provider.   Gargle warm saltwater or take cough drops to comfort your throat as directed by your health care provider.  Use a warm mist humidifier or inhale steam from a shower to increase air moisture. This may make it easier to breathe.  Drink enough fluid to keep your urine clear or pale yellow.   Eat soups and other clear broths and maintain good nutrition.   Rest as needed.   Return to work when your temperature has returned to normal or as your health care provider advises. You may need to stay home longer to avoid infecting others. You can also use a face mask and careful hand washing to prevent spread of the virus.  Increase the usage of your inhaler if you have asthma.   Do not use any tobacco products, including cigarettes, chewing tobacco, or electronic cigarettes. If you need help quitting, ask your health care provider. PREVENTION  The best way to protect yourself from getting a cold is to practice good hygiene.   Avoid oral or hand contact with people with cold   symptoms.   Wash your hands often if contact occurs.  There is no clear evidence that vitamin C, vitamin E, echinacea, or exercise reduces the chance of developing a cold. However, it is always recommended to get plenty of rest, exercise, and practice good nutrition.  SEEK MEDICAL CARE IF:   You are getting worse rather than better.   Your symptoms are not controlled by medicine.   You have chills.  You have worsening shortness of breath.  You have brown or red mucus.  You have yellow or brown nasal  discharge.  You have pain in your face, especially when you bend forward.  You have a fever.  You have swollen neck glands.  You have pain while swallowing.  You have white areas in the back of your throat. SEEK IMMEDIATE MEDICAL CARE IF:   You have severe or persistent:  Headache.  Ear pain.  Sinus pain.  Chest pain.  You have chronic lung disease and any of the following:  Wheezing.  Prolonged cough.  Coughing up blood.  A change in your usual mucus.  You have a stiff neck.  You have changes in your:  Vision.  Hearing.  Thinking.  Mood. MAKE SURE YOU:   Understand these instructions.  Will watch your condition.  Will get help right away if you are not doing well or get worse.   This information is not intended to replace advice given to you by your health care provider. Make sure you discuss any questions you have with your health care provider.   Document Released: 06/16/2000 Document Revised: 05/07/2014 Document Reviewed: 03/28/2013 Elsevier Interactive Patient Education 2016 [employer]  

## 2015-10-22 DIAGNOSIS — T7840XA Allergy, unspecified, initial encounter: Secondary | ICD-10-CM | POA: Diagnosis not present

## 2015-10-22 DIAGNOSIS — R220 Localized swelling, mass and lump, head: Secondary | ICD-10-CM | POA: Diagnosis not present

## 2015-11-11 DIAGNOSIS — M79671 Pain in right foot: Secondary | ICD-10-CM | POA: Diagnosis not present

## 2015-11-11 DIAGNOSIS — M79674 Pain in right toe(s): Secondary | ICD-10-CM | POA: Diagnosis not present

## 2015-11-11 DIAGNOSIS — L03031 Cellulitis of right toe: Secondary | ICD-10-CM | POA: Diagnosis not present

## 2015-11-11 DIAGNOSIS — L600 Ingrowing nail: Secondary | ICD-10-CM | POA: Diagnosis not present

## 2016-01-13 ENCOUNTER — Ambulatory Visit (INDEPENDENT_AMBULATORY_CARE_PROVIDER_SITE_OTHER): Payer: BLUE CROSS/BLUE SHIELD | Admitting: Family Medicine

## 2016-01-13 ENCOUNTER — Encounter: Payer: Self-pay | Admitting: Family Medicine

## 2016-01-13 VITALS — BP 117/83 | HR 104 | Temp 97.3°F | Ht 70.0 in | Wt 206.6 lb

## 2016-01-13 DIAGNOSIS — R509 Fever, unspecified: Secondary | ICD-10-CM

## 2016-01-13 MED ORDER — OSELTAMIVIR PHOSPHATE 75 MG PO CAPS: 75.0000 mg | ORAL_CAPSULE | Freq: Two times a day (BID) | ORAL | 0 refills | Status: DC

## 2016-01-13 MED ORDER — HYDROCODONE-HOMATROPINE 5-1.5 MG/5ML PO SYRP: 5.0000 mL | ORAL_SOLUTION | Freq: Four times a day (QID) | ORAL | 0 refills | Status: DC | PRN

## 2016-01-13 NOTE — Progress Notes (Signed)
   Subjective:    Patient ID: Courtney Brooks, female    DOB: 12/01/1970, 46 y.o.   MRN: [id number]  HPI 46 year old female with several day history of fever to 101 aching cough sore throat chills headache. Ears also feel stopped up. Cough is nonproductive and she has not had any postnasal drainage and sputum. There is no known exposure to similar illness  There are no active problems to display for this patient.  Outpatient Encounter Prescriptions as of 01/13/2016  Medication Sig  . fluticasone (FLONASE) 50 MCG/ACT nasal spray Place 2 sprays into both nostrils daily. (Patient not taking: Reported on 01/13/2016)  . [DISCONTINUED] predniSONE (STERAPRED UNI-PAK 21 TAB) 10 MG (21) TBPK tablet Use as directed   No facility-administered encounter medications on file as of 01/13/2016.       Review of Systems  Constitutional: Positive for fever.  HENT: Positive for congestion.   Respiratory: Positive for cough.   Neurological: Negative.        Objective:   Physical Exam  Constitutional: She is oriented to person, place, and time. She appears well-developed and well-nourished.  HENT:  Mouth/Throat: Oropharynx is clear and moist.  Tympanic membranes are dull  Cardiovascular: Normal rate and regular rhythm.   Pulmonary/Chest: Effort normal and breath sounds normal.  Neurological: She is alert and oriented to person, place, and time.   BP 117/83   Pulse (!) 104   Temp 97.3 F (36.3 C) (Oral)   Ht 5\' 10"  (1.778 m)   Wt 206 lb 9.6 oz (93.7 kg)   LMP 05/28/2011   BMI 29.64 kg/m        Assessment & Plan:  1. Febrile illness, acute Presumed flu. Will treat as such with Tamiflu and with Hycodan for cough. If she does start to see some purulent drainage or sputum call back for traditional antibiotic  Frederica KusterStephen M Miller MD

## 2016-01-15 ENCOUNTER — Telehealth: Payer: Self-pay | Admitting: Family Medicine

## 2016-01-15 MED ORDER — AMOXICILLIN 500 MG PO CAPS: 500.0000 mg | ORAL_CAPSULE | Freq: Three times a day (TID) | ORAL | 0 refills | Status: DC

## 2016-01-15 NOTE — Telephone Encounter (Signed)
rx sent to the pharmacy and pt is aware. 

## 2016-01-15 NOTE — Telephone Encounter (Signed)
Amoxicillin 500 #30 one 3 times a day for 10 days until completed

## 2016-01-30 DIAGNOSIS — J305 Allergic rhinitis due to food: Secondary | ICD-10-CM | POA: Diagnosis not present

## 2016-01-30 DIAGNOSIS — J301 Allergic rhinitis due to pollen: Secondary | ICD-10-CM | POA: Diagnosis not present

## 2016-01-30 DIAGNOSIS — T7840XA Allergy, unspecified, initial encounter: Secondary | ICD-10-CM | POA: Diagnosis not present

## 2016-03-06 DIAGNOSIS — K219 Gastro-esophageal reflux disease without esophagitis: Secondary | ICD-10-CM | POA: Diagnosis not present

## 2016-03-06 DIAGNOSIS — T783XXA Angioneurotic edema, initial encounter: Secondary | ICD-10-CM | POA: Diagnosis not present

## 2016-03-26 ENCOUNTER — Encounter: Payer: Self-pay | Admitting: Family Medicine

## 2016-03-26 ENCOUNTER — Ambulatory Visit (INDEPENDENT_AMBULATORY_CARE_PROVIDER_SITE_OTHER): Payer: BLUE CROSS/BLUE SHIELD | Admitting: Family Medicine

## 2016-03-26 VITALS — BP 125/80 | HR 103 | Temp 98.5°F | Ht 70.0 in | Wt 201.0 lb

## 2016-03-26 DIAGNOSIS — N3000 Acute cystitis without hematuria: Secondary | ICD-10-CM

## 2016-03-26 DIAGNOSIS — R399 Unspecified symptoms and signs involving the genitourinary system: Secondary | ICD-10-CM | POA: Diagnosis not present

## 2016-03-26 LAB — MICROSCOPIC EXAMINATION
Epithelial Cells (non renal): >10 /hpf — AB (ref 0–10)
RENAL EPITHEL UA: NONE SEEN /HPF

## 2016-03-26 LAB — URINALYSIS, COMPLETE
Bilirubin, UA: NEGATIVE
GLUCOSE, UA: NEGATIVE
Ketones, UA: NEGATIVE
NITRITE UA: POSITIVE — AB
PH UA: 5.5 (ref 5.0–7.5)
PROTEIN UA: NEGATIVE
Specific Gravity, UA: 1.02 (ref 1.005–1.030)
UUROB: 0.2 mg/dL (ref 0.2–1.0)

## 2016-03-26 MED ORDER — SULFAMETHOXAZOLE-TRIMETHOPRIM 800-160 MG PO TABS: 1.0000 | ORAL_TABLET | Freq: Two times a day (BID) | ORAL | 0 refills | Status: DC

## 2016-03-26 NOTE — Progress Notes (Signed)
BP 125/80   Pulse (!) 103   Temp 98.5 F (36.9 C) (Oral)   Ht 5\' 10"  (1.778 m)   Wt 201 lb (91.2 kg)   LMP 05/28/2011   BMI 28.84 kg/m    Subjective:    Patient ID: Courtney Brooks, female    DOB: 07/19/1970, 46 y.o.   MRN: [id number]  HPI: Courtney Brooks is a 46 y.o. female presenting on 03/26/2016 for Urinary Tract Infection   HPI Urinary odor and back pain Patient comes in today with complaints of urinary odor and back pain that has been going on for the past 5 days. She said initially started with back pain which has subsided some and then she developed urinary odor and frequency. She denies any dysuria or hematuria. She denies any fevers or chills or abdominal pain. She has gotten urinary tract infections somewhat frequently in her past and she felt like this was one coming on again.  Relevant past medical, surgical, family and social history reviewed and updated as indicated. Interim medical history since our last visit reviewed. Allergies and medications reviewed and updated.  Review of Systems  Constitutional: Negative for chills and fever.  Respiratory: Negative for chest tightness and shortness of breath.   Cardiovascular: Negative for chest pain and leg swelling.  Gastrointestinal: Negative for abdominal pain.  Genitourinary: Positive for flank pain, frequency and urgency. Negative for decreased urine volume, difficulty urinating, dysuria, hematuria, vaginal bleeding, vaginal discharge and vaginal pain.  Musculoskeletal: Negative for back pain and gait problem.  Skin: Negative for rash.  Neurological: Negative for light-headedness and headaches.  Psychiatric/Behavioral: Negative for agitation and behavioral problems.  All other systems reviewed and are negative.   Per HPI unless specifically indicated above     Objective:    BP 125/80   Pulse (!) 103   Temp 98.5 F (36.9 C) (Oral)   Ht 5\' 10"  (1.778 m)   Wt 201 lb (91.2 kg)   LMP 05/28/2011   BMI 28.84 kg/m    Wt Readings from Last 3 Encounters:  03/26/16 201 lb (91.2 kg)  01/13/16 206 lb 9.6 oz (93.7 kg)  10/09/15 202 lb 6.4 oz (91.8 kg)    Physical Exam  Constitutional: She is oriented to person, place, and time. She appears well-developed and well-nourished. No distress.  Eyes: Conjunctivae are normal.  Cardiovascular: Normal rate, regular rhythm, normal heart sounds and intact distal pulses.   No murmur heard. Pulmonary/Chest: Effort normal and breath sounds normal. No respiratory distress. She has no wheezes.  Abdominal: Soft. Bowel sounds are normal. She exhibits no distension. There is no tenderness. There is no rigidity, no rebound, no guarding and no CVA tenderness.  Musculoskeletal: Normal range of motion. She exhibits no edema or tenderness.  Neurological: She is alert and oriented to person, place, and time. Coordination normal.  Skin: Skin is warm and dry. No rash noted. She is not diaphoretic.  Psychiatric: She has a normal mood and affect. Her behavior is normal.  Nursing note and vitals reviewed.   Urinalysis: 6-10 WBCs, 3-10 rbc's, greater than 10 epithelial cells, many bacteria, mucus present, nitrite positive    Assessment & Plan:   Problem List Items Addressed This Visit    None    Visit Diagnoses    Acute cystitis without hematuria    -  Primary   Relevant Medications   sulfamethoxazole-trimethoprim (BACTRIM DS,SEPTRA DS) 800-160 MG tablet   Other Relevant Orders   Urinalysis, Complete  Urine culture       Follow up plan: Return if symptoms worsen or fail to improve.  Counseling provided for all of the vaccine components Orders Placed This Encounter  Procedures  . Urine culture  . Urinalysis, Complete    Arville Care, MD [facility] 03/26/2016, 3:52 PM

## 2016-03-30 LAB — URINE CULTURE

## 2016-03-31 ENCOUNTER — Telehealth: Payer: Self-pay | Admitting: Family Medicine

## 2016-03-31 MED ORDER — CIPROFLOXACIN HCL 500 MG PO TABS: 500.0000 mg | ORAL_TABLET | Freq: Two times a day (BID) | ORAL | 0 refills | Status: DC

## 2016-03-31 NOTE — Telephone Encounter (Signed)
Left message for patient to call.

## 2016-04-06 ENCOUNTER — Telehealth: Payer: Self-pay | Admitting: Family Medicine

## 2016-04-06 NOTE — Telephone Encounter (Signed)
Erroneous visit

## 2016-04-16 NOTE — Telephone Encounter (Signed)
Patient states she already knew about antibiotic change.

## 2016-06-17 ENCOUNTER — Encounter: Payer: Self-pay | Admitting: Obstetrics and Gynecology

## 2016-06-21 ENCOUNTER — Encounter: Payer: Self-pay | Admitting: Obstetrics and Gynecology

## 2016-06-21 ENCOUNTER — Ambulatory Visit (INDEPENDENT_AMBULATORY_CARE_PROVIDER_SITE_OTHER): Payer: BLUE CROSS/BLUE SHIELD | Admitting: Obstetrics and Gynecology

## 2016-06-21 VITALS — BP 122/88 | HR 100 | Ht 70.0 in | Wt 206.0 lb

## 2016-06-21 DIAGNOSIS — Z01419 Encounter for gynecological examination (general) (routine) without abnormal findings: Secondary | ICD-10-CM | POA: Diagnosis not present

## 2016-06-21 DIAGNOSIS — Z1231 Encounter for screening mammogram for malignant neoplasm of breast: Secondary | ICD-10-CM | POA: Diagnosis not present

## 2016-06-21 DIAGNOSIS — Z1239 Encounter for other screening for malignant neoplasm of breast: Secondary | ICD-10-CM

## 2016-06-21 NOTE — Progress Notes (Signed)
Gynecology Annual Exam  PCP: Dettinger, Elige Radon, MD  Chief Complaint:  Chief Complaint  Patient presents with  . Gynecologic Exam    History of Present Illness: Patient is a 46 y.o. G2P2002 presents for annual exam. The patient has no complaints today.   LMP: Patient's last menstrual period was 05/28/2011.  Some light monthly spotting   The patient is sexually active. She currently uses tubal ligation for contraception. She denies dyspareunia.  The patient does perform self breast exams.  There is no notable family history of breast or ovarian cancer in her family.  The patient wears seatbelts: yes.   The patient has regular exercise: not asked.    The patient denies current symptoms of depression.    Review of Systems: Review of Systems  Constitutional: Negative for chills and fever.  HENT: Negative for congestion.   Respiratory: Negative for cough and shortness of breath.   Cardiovascular: Negative for chest pain and palpitations.  Gastrointestinal: Negative for abdominal pain, constipation, diarrhea, heartburn, nausea and vomiting.  Genitourinary: Negative for dysuria, frequency and urgency.  Skin: Negative for itching and rash.  Neurological: Negative for dizziness and headaches.  Endo/Heme/Allergies: Negative for polydipsia.  Psychiatric/Behavioral: Negative for depression.    Past Medical History:  Past Medical History:  Diagnosis Date  . Endometriosis   . GERD (gastroesophageal reflux disease)   . Hx: UTI (urinary tract infection)     Past Surgical History:  Past Surgical History:  Procedure Laterality Date  . APPENDECTOMY    . BACK SURGERY  10/2014  . ENDOMETRIAL ABLATION  03/25/2011  . RIGHT OOPHORECTOMY  2006  . TUBAL LIGATION  1993    Gynecologic History:  Patient's last menstrual period was 05/28/2011. Contraception:Tubal ligation Last Pap: Results Pap 06/12/15 NIL HPV negative q3y Last Mammo [facility] 07/10/16 Bi=RAD IV marker clip biopsy  fibroadenoma routine screening this year  Obstetric History: G2P2002  Family History:  Family History  Problem Relation Age of Onset  . Hypertension Mother   . Kidney disease Mother   . Diabetes Mother   . Heart disease Father   . Breast cancer Paternal Grandmother 67  . Lung cancer Maternal Grandmother   . Hypertension Maternal Grandmother   . Colon cancer Neg Hx     Social History:  Social History   Social History  . Marital status: Married    Spouse name: N/A  . Number of children: 2  . Years of education: N/A   Occupational History  . Furniture conservator/restorer    Social History Main Topics  . Smoking status: Never Smoker  . Smokeless tobacco: Never Used  . Alcohol use 2.0 oz/week    4 drink(s) per week     Comment: occ  . Drug use: No  . Sexual activity: Not on file   Other Topics Concern  . Not on file   Social History Narrative   Daily caffeine     Allergies:  Allergies  Allergen Reactions  . Percocet [Oxycodone-Acetaminophen] Itching    Medications: Prior to Admission medications   Medication Sig Start Date End Date Taking? Authorizing Provider  fluticasone (FLONASE) 50 MCG/ACT nasal spray Place 2 sprays into both nostrils daily. 10/09/15  Yes Hawks, Christy A, FNP  PARoxetine Mesylate (BRISDELLE) 7.5 MG CAPS Take by mouth.   Yes Vena Austria, MD    Physical Exam Vitals: Blood pressure 122/88, pulse 100, height 5\' 10"  (1.778 m), weight 206 lb (93.4 kg), last menstrual period 05/28/2011.  General:  NAD HEENT: normocephalic, anicteric Thyroid: no enlargement, no palpable nodules Pulmonary: No increased work of breathing, CTAB Cardiovascular: RRR, distal pulses 2+ Breast: Breast symmetrical, no tenderness, no palpable nodules or masses, no skin or nipple retraction present, no nipple discharge.  No axillary or supraclavicular lymphadenopathy. Abdomen: NABS, soft, non-tender, non-distended.  Umbilicus without lesions.  No hepatomegaly, splenomegaly or  masses palpable. No evidence of hernia  Genitourinary:  External: Normal external female genitalia.  Normal urethral meatus, normal  Bartholin's and Skene's glands.    Vagina: Normal vaginal mucosa, no evidence of prolapse.    Cervix: Grossly normal in appearance, no bleeding  Uterus: Non-enlarged, mobile, normal contour.  No CMT  Adnexa: ovaries non-enlarged, no adnexal masses  Rectal: deferred  Lymphatic: no evidence of inguinal lymphadenopathy Extremities: no edema, erythema, or tenderness Neurologic: Grossly intact Psychiatric: mood appropriate, affect full  Female chaperone present for pelvic and breast  portions of the physical exam    Assessment: 46 y.o. [id number] No problem-specific Assessment & Plan notes found for this encounter.   Plan: Problem List Items Addressed This Visit    None      1) Mammogram - recommend yearly screening mammogram.  Mammogram Was ordered today   2) STI screening was not offered   3) ASCCP guidelines and rational discussed.  Patient opts for every 3 years screening interval  4) No bleeding concerns since ablation  5) Routine healthcare maintenance including cholesterol, diabetes screening discussed managed by PCP  6) Colonoscopy UTD next age 46

## 2016-06-23 ENCOUNTER — Encounter: Payer: Self-pay | Admitting: Obstetrics and Gynecology

## 2016-06-29 DIAGNOSIS — Z1231 Encounter for screening mammogram for malignant neoplasm of breast: Secondary | ICD-10-CM | POA: Diagnosis not present

## 2016-06-29 LAB — HM MAMMOGRAPHY: HM Mammogram: ABNORMAL — AB (ref 0–4)

## 2016-07-21 ENCOUNTER — Encounter: Payer: Self-pay | Admitting: Family Medicine

## 2016-07-21 ENCOUNTER — Ambulatory Visit (INDEPENDENT_AMBULATORY_CARE_PROVIDER_SITE_OTHER): Payer: BLUE CROSS/BLUE SHIELD | Admitting: Family Medicine

## 2016-07-21 VITALS — BP 119/87 | HR 81 | Temp 97.0°F | Ht 70.0 in | Wt 209.0 lb

## 2016-07-21 DIAGNOSIS — M791 Myalgia, unspecified site: Secondary | ICD-10-CM

## 2016-07-21 DIAGNOSIS — G44209 Tension-type headache, unspecified, not intractable: Secondary | ICD-10-CM

## 2016-07-21 DIAGNOSIS — W57XXXD Bitten or stung by nonvenomous insect and other nonvenomous arthropods, subsequent encounter: Secondary | ICD-10-CM | POA: Diagnosis not present

## 2016-07-21 NOTE — Progress Notes (Signed)
BP 119/87   Pulse 81   Temp (!) 97 F (36.1 C) (Oral)   Ht 5' 10"  (1.778 m)   Wt 209 lb (94.8 kg)   LMP 05/28/2011   BMI 29.99 kg/m    Subjective:    Patient ID: Courtney Brooks, female    DOB: Mar 22, 1970, 46 y.o.   MRN: [id number]  HPI: Chantay Whitelock is a 46 y.o. female presenting on 07/21/2016 for Tickbite (x 4 weeks ago; was seen yesterday at clinic at work for headache, nausea, stiffness in neck;, wanted her to followup with with her doctor's office;  was given Doxycyline 100 mg (21 days), Phenergan; they were unable to do lab testing there)   HPI Tick bite Patient is coming in today for a tick bite that she sustained 4 weeks ago. She started on Doxycycline by her office nurse 2 days ago and given 3 weeks of it and given Phenergan but they were unable to do the lab testing there and they recommended for her to come to our office today. She has been having headaches and myalgias and arthralgias that have been going on for the past few days and then increasing. She denies any rashes or fevers or chills. She says the tick was pulled from her left upper thorax near the midaxillary line on the back. She still has a small spot that is mostly healed up from that. She denies any other rashes. The myalgias are diffuse and generalized as well as the arthralgias. The headache is posterior and tension-like and comes up over the top of her head.  Relevant past medical, surgical, family and social history reviewed and updated as indicated. Interim medical history since our last visit reviewed. Allergies and medications reviewed and updated.  Review of Systems  Constitutional: Positive for appetite change and fatigue. Negative for chills and fever.  HENT: Negative for congestion, ear discharge and ear pain.   Eyes: Negative for redness and visual disturbance.  Respiratory: Negative for chest tightness and shortness of breath.   Cardiovascular: Negative for chest pain and leg swelling.    Genitourinary: Negative for difficulty urinating and dysuria.  Musculoskeletal: Positive for arthralgias and myalgias. Negative for back pain and gait problem.  Skin: Negative for rash.  Neurological: Positive for headaches. Negative for light-headedness.  Psychiatric/Behavioral: Negative for agitation and behavioral problems.  All other systems reviewed and are negative.   Per HPI unless specifically indicated above        Objective:    BP 119/87   Pulse 81   Temp (!) 97 F (36.1 C) (Oral)   Ht 5' 10"  (1.778 m)   Wt 209 lb (94.8 kg)   LMP 05/28/2011   BMI 29.99 kg/m   Wt Readings from Last 3 Encounters:  07/21/16 209 lb (94.8 kg)  06/21/16 206 lb (93.4 kg)  03/26/16 201 lb (91.2 kg)    Physical Exam  Constitutional: She is oriented to person, place, and time. She appears well-developed and well-nourished. No distress.  HENT:  Right Ear: External ear normal.  Left Ear: External ear normal.  Nose: Nose normal.  Mouth/Throat: Oropharynx is clear and moist. No oropharyngeal exudate.  Eyes: Conjunctivae are normal.  Neck: Neck supple. No thyromegaly present.  Cardiovascular: Normal rate, regular rhythm, normal heart sounds and intact distal pulses.   No murmur heard. Pulmonary/Chest: Effort normal and breath sounds normal. No respiratory distress. She has no wheezes. She has no rales.  Musculoskeletal: Normal range of motion. She exhibits  no tenderness (Unable to elicit tenderness in neck or back or anywhere in the body on exam).  Lymphadenopathy:    She has no cervical adenopathy.  Neurological: She is alert and oriented to person, place, and time. Coordination normal.  Skin: Skin is warm and dry. No rash noted. She is not diaphoretic.  Psychiatric: She has a normal mood and affect. Her behavior is normal.  Nursing note and vitals reviewed.       Assessment & Plan:   Problem List Items Addressed This Visit    None    Visit Diagnoses    Tick bite, subsequent  encounter    -  Primary   Patient started doxycycline 2 days ago, illness started 4 days ago, we will do testing, tick bite 4 weeks ago left side of upper thorax   Relevant Orders   CBC with Differential/Platelet (Completed)   CMP14+EGFR (Completed)   TSH (Completed)   Rocky mtn spotted fvr abs pnl(IgG+IgM) (Completed)   Lyme Ab/Western Blot Reflex (Completed)   Alpha-Gal Panel (Completed)   Myalgia       Relevant Orders   CBC with Differential/Platelet (Completed)   CMP14+EGFR (Completed)   TSH (Completed)   Rocky mtn spotted fvr abs pnl(IgG+IgM) (Completed)   Lyme Ab/Western Blot Reflex (Completed)   Alpha-Gal Panel (Completed)   Tension-type headache, not intractable, unspecified chronicity pattern       Relevant Orders   CBC with Differential/Platelet (Completed)   CMP14+EGFR (Completed)   TSH (Completed)   Rocky mtn spotted fvr abs pnl(IgG+IgM) (Completed)   Lyme Ab/Western Blot Reflex (Completed)   Alpha-Gal Panel (Completed)       Follow up plan: Return if symptoms worsen or fail to improve.  Counseling provided for all of the vaccine components Orders Placed This Encounter  Procedures  . CBC with Differential/Platelet  . CMP14+EGFR  . TSH  . Rocky mtn spotted fvr abs pnl(IgG+IgM)  . Lyme Ab/Western Blot Reflex  . Alpha-Gal Panel    Caryl Pina, MD [facility] 07/21/2016, 4:35 PM

## 2016-07-26 LAB — CBC WITH DIFFERENTIAL/PLATELET
BASOS: 1 %
Basophils Absolute: 0 10*3/uL (ref 0.0–0.2)
EOS (ABSOLUTE): 0.2 10*3/uL (ref 0.0–0.4)
EOS: 4 %
Hematocrit: 40.2 % (ref 34.0–46.6)
Hemoglobin: 13.7 g/dL (ref 11.1–15.9)
IMMATURE GRANS (ABS): 0 10*3/uL (ref 0.0–0.1)
IMMATURE GRANULOCYTES: 0 %
LYMPHS: 36 %
Lymphocytes Absolute: 2.3 10*3/uL (ref 0.7–3.1)
MCH: 30.8 pg (ref 26.6–33.0)
MCHC: 34.1 g/dL (ref 31.5–35.7)
MCV: 90 fL (ref 79–97)
MONOCYTES: 6 %
Monocytes Absolute: 0.4 10*3/uL (ref 0.1–0.9)
NEUTROS PCT: 53 %
Neutrophils Absolute: 3.3 10*3/uL (ref 1.4–7.0)
PLATELETS: 326 10*3/uL (ref 150–379)
RBC: 4.45 x10E6/uL (ref 3.77–5.28)
RDW: 14.2 % (ref 12.3–15.4)
WBC: 6.3 10*3/uL (ref 3.4–10.8)

## 2016-07-26 LAB — CMP14+EGFR
A/G RATIO: 1.6 (ref 1.2–2.2)
ALT: 15 IU/L (ref 0–32)
AST: 17 IU/L (ref 0–40)
Albumin: 4.2 g/dL (ref 3.5–5.5)
Alkaline Phosphatase: 68 IU/L (ref 39–117)
BUN/Creatinine Ratio: 18 (ref 9–23)
BUN: 15 mg/dL (ref 6–24)
Bilirubin Total: 0.2 mg/dL (ref 0.0–1.2)
CALCIUM: 9.7 mg/dL (ref 8.7–10.2)
CO2: 24 mmol/L (ref 20–29)
Chloride: 102 mmol/L (ref 96–106)
Creatinine, Ser: 0.82 mg/dL (ref 0.57–1.00)
GFR, EST AFRICAN AMERICAN: 98 mL/min/{1.73_m2} (ref >59)
GFR, EST NON AFRICAN AMERICAN: 85 mL/min/{1.73_m2} (ref >59)
Globulin, Total: 2.7 g/dL (ref 1.5–4.5)
Glucose: 91 mg/dL (ref 65–99)
POTASSIUM: 4.1 mmol/L (ref 3.5–5.2)
Sodium: 140 mmol/L (ref 134–144)
TOTAL PROTEIN: 6.9 g/dL (ref 6.0–8.5)

## 2016-07-26 LAB — ALPHA-GAL PANEL
Alpha Gal IgE*: <0.1 kU/L (ref <0.35)
Class Interpretation: 0
Class Interpretation: 0
Class Interpretation: 0
Lamb/Mutton (Ovis spp) IgE: <0.1 kU/L (ref <0.35)

## 2016-07-26 LAB — ROCKY MTN SPOTTED FVR ABS PNL(IGG+IGM)
RMSF IGG: NEGATIVE
RMSF IGM: 0.46 {index} (ref 0.00–0.89)

## 2016-07-26 LAB — TSH: TSH: 1.31 u[IU]/mL (ref 0.450–4.500)

## 2016-07-26 LAB — LYME AB/WESTERN BLOT REFLEX

## 2016-08-02 ENCOUNTER — Ambulatory Visit (INDEPENDENT_AMBULATORY_CARE_PROVIDER_SITE_OTHER): Payer: BLUE CROSS/BLUE SHIELD | Admitting: Family Medicine

## 2016-08-02 ENCOUNTER — Encounter: Payer: Self-pay | Admitting: Family Medicine

## 2016-08-02 VITALS — BP 112/78 | HR 89 | Temp 98.1°F | Ht 70.0 in | Wt 209.0 lb

## 2016-08-02 DIAGNOSIS — G44209 Tension-type headache, unspecified, not intractable: Secondary | ICD-10-CM

## 2016-08-02 MED ORDER — TIZANIDINE HCL 2 MG PO CAPS: 2.0000 mg | ORAL_CAPSULE | Freq: Three times a day (TID) | ORAL | 0 refills | Status: DC

## 2016-08-02 MED ORDER — KETOROLAC TROMETHAMINE 60 MG/2ML IM SOLN
60.0000 mg | Freq: Once | INTRAMUSCULAR | Status: AC
  Administered 2016-08-02: 60 mg via INTRAMUSCULAR

## 2016-08-02 NOTE — Progress Notes (Signed)
BP 112/78   Pulse 89   Temp 98.1 F (36.7 C) (Oral)   Ht 5\' 10"  (1.778 m)   Wt 209 lb (94.8 kg)   LMP 05/28/2011   BMI 29.99 kg/m    Subjective:    Patient ID: Courtney Brooks, female    DOB: 11/12/1970, 46 y.o.   MRN: [id number]  HPI: Courtney Brooks is a 46 y.o. female presenting on 08/02/2016 for Headache (still having headaches, shooting pain in neck, blurry vision, left ear pain; went to eye doctor today and her vision had not changed from one year ago)   HPI Headache Patient has a headache that is in the back of her neck and then comes over the top of her head sometimes. She feels like she does have a little pressure in her ear and sometimes has blurred vision associated with it. She did go see an ophthalmologist and get her eyes checked and they checked out fine but mainly she has the pain down the back of her neck that shooting down both arms. She denies any focal numbness or weakness or pain radiating anywhere else besides her head neck and shoulders.  Relevant past medical, surgical, family and social history reviewed and updated as indicated. Interim medical history since our last visit reviewed. Allergies and medications reviewed and updated.  Review of Systems  Constitutional: Negative for chills and fever.  Eyes: Positive for visual disturbance. Negative for redness.  Respiratory: Negative for chest tightness and shortness of breath.   Cardiovascular: Negative for chest pain and leg swelling.  Musculoskeletal: Negative for back pain and gait problem.  Skin: Negative for rash.  Neurological: Positive for headaches. Negative for dizziness, weakness, light-headedness and numbness.  Psychiatric/Behavioral: Negative for agitation and behavioral problems.  All other systems reviewed and are negative.  Per HPI unless specifically indicated above     Objective:    BP 112/78   Pulse 89   Temp 98.1 F (36.7 C) (Oral)   Ht 5\' 10"  (1.778 m)   Wt 209 lb (94.8 kg)   LMP  05/28/2011   BMI 29.99 kg/m   Wt Readings from Last 3 Encounters:  08/02/16 209 lb (94.8 kg)  07/21/16 209 lb (94.8 kg)  06/21/16 206 lb (93.4 kg)    Physical Exam  Constitutional: She is oriented to person, place, and time. She appears well-developed and well-nourished. No distress.  Eyes: Pupils are equal, round, and reactive to light. Conjunctivae and EOM are normal.  Neck: Neck supple. No thyromegaly present.  Cardiovascular: Normal rate, regular rhythm, normal heart sounds and intact distal pulses.   No murmur heard. Pulmonary/Chest: Effort normal and breath sounds normal. No respiratory distress. She has no wheezes. She has no rales.  Musculoskeletal: Normal range of motion.       Cervical back: She exhibits tenderness (Tension in both the muscles on both sides of her neck.) and spasm. She exhibits normal range of motion, no bony tenderness, no swelling and no deformity.  Lymphadenopathy:    She has no cervical adenopathy.  Neurological: She is alert and oriented to person, place, and time. She has normal strength. No cranial nerve deficit or sensory deficit. Coordination normal.  Skin: Skin is warm and dry. No rash noted. She is not diaphoretic.  Psychiatric: She has a normal mood and affect. Her behavior is normal.  Nursing note and vitals reviewed.     Assessment & Plan:   Problem List Items Addressed This Visit  None    Visit Diagnoses    Tension headache    -  Primary   Relevant Medications   ketorolac (TORADOL) injection 60 mg (Completed)   tizanidine (ZANAFLEX) 2 MG capsule       Follow up plan: Return if symptoms worsen or fail to improve.  Counseling provided for all of the vaccine components No orders of the defined types were placed in this encounter.   Arville CareJoshua Diamonds Lippard, MD [facility] 08/02/2016, 4:47 PM

## 2016-09-07 ENCOUNTER — Telehealth: Payer: Self-pay | Admitting: Family Medicine

## 2016-09-07 DIAGNOSIS — R51 Headache: Principal | ICD-10-CM

## 2016-09-07 DIAGNOSIS — R519 Headache, unspecified: Secondary | ICD-10-CM

## 2016-09-07 NOTE — Telephone Encounter (Signed)
Please refer her to neurology 

## 2016-09-10 ENCOUNTER — Encounter: Payer: Self-pay | Admitting: Neurology

## 2016-09-13 ENCOUNTER — Encounter: Payer: Self-pay | Admitting: Family Medicine

## 2016-09-13 ENCOUNTER — Ambulatory Visit (INDEPENDENT_AMBULATORY_CARE_PROVIDER_SITE_OTHER): Payer: BLUE CROSS/BLUE SHIELD | Admitting: Family Medicine

## 2016-09-13 VITALS — BP 100/74 | HR 73 | Temp 97.9°F | Ht 70.0 in | Wt 208.0 lb

## 2016-09-13 DIAGNOSIS — G4452 New daily persistent headache (NDPH): Secondary | ICD-10-CM | POA: Diagnosis not present

## 2016-09-13 DIAGNOSIS — R51 Headache: Secondary | ICD-10-CM | POA: Diagnosis not present

## 2016-09-13 DIAGNOSIS — R519 Headache, unspecified: Secondary | ICD-10-CM

## 2016-09-13 DIAGNOSIS — G8929 Other chronic pain: Secondary | ICD-10-CM

## 2016-09-13 MED ORDER — BUTALBITAL-APAP-CAFF-COD 50-325-40-30 MG PO CAPS: 1.0000 | ORAL_CAPSULE | ORAL | 1 refills | Status: DC | PRN

## 2016-09-13 MED ORDER — ONDANSETRON 8 MG PO TBDP: 8.0000 mg | ORAL_TABLET | Freq: Three times a day (TID) | ORAL | 0 refills | Status: DC | PRN

## 2016-09-13 NOTE — Progress Notes (Signed)
Subjective:  Patient ID: Courtney Brooks, female    DOB: 06/03/1970  Age: 46 y.o. MRN: [id number]  CC: Headache (pt here today c/o headache, pains in left ear and jaw. Neuro appt in December.)   HPI Courtney PeekDee Ann Lobdell presents for Reevaluation of headache. They are now daily 7/10 an ache at the left nuchal ridge posteriorly.There are intermittent sharp pains that take the pain up to 10/10. She gets a bit of relief from the muscle relaxer but it makes her so drowsy she can only take it at night. However she still wakes up during the night with severe pain.This pain in his also radiating down the margin of the sternocleidal mastoid muscle on the left. It causes nausea as a side effect. Phenergan helped some but it causes excessive drowsiness for her.  [facility] 09/13/2016 08/02/2016 07/21/2016  Decreased Interest 0 0 0  Down, Depressed, Hopeless 0 0 0  PHQ - 2 Score 0 0 0    History Jalexia has a past medical history of Endometriosis; Family history of breast cancer; GERD (gastroesophageal reflux disease); and UTI (urinary tract infection).   She has a past surgical history that includes Appendectomy; Right oophorectomy (2006); Endometrial ablation (03/25/2011); Back surgery (10/2014); and Tubal ligation (1993).   Her family history includes Breast cancer (age of onset: 3745) in her paternal grandmother; Diabetes in her mother; Heart disease in her father; Hypertension in her maternal grandmother and mother; Kidney disease in her mother; Lung cancer in her maternal grandmother.She reports that she has never smoked. She has never used smokeless tobacco. She reports that she drinks about 2.0 oz of alcohol per week . She reports that she does not use drugs.    ROS Review of Systems  Constitutional: Negative for activity change, appetite change and fever.  HENT: Negative for congestion, rhinorrhea and sore throat.   Eyes: Negative for visual disturbance.  Respiratory: Negative for cough and  shortness of breath.   Cardiovascular: Negative for chest pain and palpitations.  Gastrointestinal: Negative for abdominal pain, diarrhea and nausea.  Genitourinary: Negative for dysuria.  Musculoskeletal: Negative for arthralgias and myalgias.  Neurological: Positive for headaches. Negative for dizziness, tremors, seizures, syncope, facial asymmetry, speech difficulty, weakness, light-headedness and numbness.    Objective:  BP 100/74   Pulse 73   Temp 97.9 F (36.6 C) (Oral)   Ht 5\' 10"  (1.778 m)   Wt 208 lb (94.3 kg)   LMP 05/28/2011   BMI 29.84 kg/m   BP Readings from Last 3 Encounters:  09/13/16 100/74  08/02/16 112/78  07/21/16 119/87    Wt Readings from Last 3 Encounters:  09/13/16 208 lb (94.3 kg)  08/02/16 209 lb (94.8 kg)  07/21/16 209 lb (94.8 kg)     Physical Exam  Constitutional: She is oriented to person, place, and time. She appears well-developed and well-nourished. No distress.  HENT:  Head: Normocephalic and atraumatic.  Right Ear: External ear normal.  Left Ear: External ear normal.  Nose: Nose normal.  Mouth/Throat: Oropharynx is clear and moist.  Eyes: Pupils are equal, round, and reactive to light. Conjunctivae and EOM are normal.  Neck: Normal range of motion. Neck supple. No thyromegaly present.  Cardiovascular: Normal rate, regular rhythm and normal heart sounds.   No murmur heard. Pulmonary/Chest: Effort normal and breath sounds normal. No respiratory distress. She has no wheezes. She has no rales.  Abdominal: Soft. Bowel sounds are normal. She exhibits no distension. There is no tenderness.  Lymphadenopathy:    She has no cervical adenopathy.  Neurological: She is alert and oriented to person, place, and time. She has normal strength and normal reflexes. No sensory deficit.  Reflex Scores:      Bicep reflexes are 2+ on the right side and 2+ on the left side.      Patellar reflexes are 2+ on the right side and 2+ on the left side. Skin:  Skin is warm and dry.  Psychiatric: She has a normal mood and affect. Her behavior is normal. Judgment and thought content normal.      Assessment & Plan:   Eladia was seen today for headache.  Diagnoses and all orders for this visit:  Chronic intractable headache, unspecified headache type  New daily persistent headache -     MR BRAIN W WO CONTRAST; Future  Other orders -     butalbital-acetaminophen-caffeine (FIORICET/CODEINE) 50-325-40-30 MG capsule; Take 1 capsule by mouth every 4 (four) hours as needed for headache. -     ondansetron (ZOFRAN ODT) 8 MG disintegrating tablet; Take 1 tablet (8 mg total) by mouth every 8 (eight) hours as needed for nausea or vomiting.       I have discontinued Ms. Shadden's doxycycline. I am also having her start on butalbital-acetaminophen-caffeine and ondansetron. Additionally, I am having her maintain her fluticasone, promethazine, and tizanidine.  Allergies as of 09/13/2016      Reactions   Percocet [oxycodone-acetaminophen] Itching      Medication List       Accurate as of 09/13/16  4:53 PM. Always use your most recent med list.          butalbital-acetaminophen-caffeine 50-325-40-30 MG capsule Commonly known as:  FIORICET/CODEINE Take 1 capsule by mouth every 4 (four) hours as needed for headache.   fluticasone 50 MCG/ACT nasal spray Commonly known as:  FLONASE Place 2 sprays into both nostrils daily.   ondansetron 8 MG disintegrating tablet Commonly known as:  ZOFRAN ODT Take 1 tablet (8 mg total) by mouth every 8 (eight) hours as needed for nausea or vomiting.   promethazine 12.5 MG tablet Commonly known as:  PHENERGAN Take 12.5 mg by mouth as needed.   tizanidine 2 MG capsule Commonly known as:  ZANAFLEX Take 1 capsule (2 mg total) by mouth 3 (three) times daily.            Discharge Care Instructions        Start     Ordered   09/13/16 0000  MR BRAIN W WO CONTRAST    Question Answer Comment  If indicated  for the ordered procedure, I authorize the administration of contrast media per Radiology protocol Yes   What is the patient's sedation requirement? No Sedation   Does the patient have a pacemaker or implanted devices? No   Radiology Contrast Protocol - do NOT remove file path [email or web address]   Preferred imaging location? Internal      09/13/16 1629   09/13/16 0000  butalbital-acetaminophen-caffeine (FIORICET/CODEINE) 50-325-40-30 MG capsule  Every 4 hours PRN     09/13/16 1638   09/13/16 0000  ondansetron (ZOFRAN ODT) 8 MG disintegrating tablet  Every 8 hours PRN     09/13/16 1646       Follow-up: No Follow-up on file.  Mechele Claude, M.D.

## 2016-09-21 ENCOUNTER — Telehealth: Payer: Self-pay | Admitting: Family Medicine

## 2016-09-21 MED ORDER — LORAZEPAM 1 MG PO TABS: ORAL_TABLET | ORAL | 0 refills | Status: DC

## 2016-09-21 NOTE — Telephone Encounter (Signed)
Rx called to pharmacy and patient aware 

## 2016-09-21 NOTE — Telephone Encounter (Signed)
Okay to call in ativan 1 mg po X1. One hour before procedure.

## 2016-09-21 NOTE — Telephone Encounter (Signed)
Please review and advise.

## 2016-09-22 ENCOUNTER — Ambulatory Visit (HOSPITAL_COMMUNITY)
Admission: RE | Admit: 2016-09-22 | Discharge: 2016-09-22 | Disposition: A | Discharge status: Home / Self Care | Payer: BLUE CROSS/BLUE SHIELD | Source: Ambulatory Visit | Attending: Family Medicine | Admitting: Family Medicine

## 2016-09-22 DIAGNOSIS — G4452 New daily persistent headache (NDPH): Secondary | ICD-10-CM

## 2016-09-22 DIAGNOSIS — R51 Headache: Secondary | ICD-10-CM | POA: Diagnosis not present

## 2016-09-22 MED ORDER — GADOBENATE DIMEGLUMINE 529 MG/ML IV SOLN
20.0000 mL | Freq: Once | INTRAVENOUS | Status: AC | PRN
  Administered 2016-09-22: 20 mL via INTRAVENOUS

## 2016-09-29 ENCOUNTER — Ambulatory Visit (INDEPENDENT_AMBULATORY_CARE_PROVIDER_SITE_OTHER): Payer: BLUE CROSS/BLUE SHIELD | Admitting: Family Medicine

## 2016-09-29 ENCOUNTER — Ambulatory Visit: Payer: BLUE CROSS/BLUE SHIELD | Admitting: Family Medicine

## 2016-09-29 ENCOUNTER — Encounter: Payer: Self-pay | Admitting: Family Medicine

## 2016-09-29 VITALS — BP 115/80 | HR 86 | Temp 98.2°F | Ht 70.0 in | Wt 210.0 lb

## 2016-09-29 DIAGNOSIS — R51 Headache: Secondary | ICD-10-CM

## 2016-09-29 DIAGNOSIS — G8929 Other chronic pain: Secondary | ICD-10-CM

## 2016-09-29 DIAGNOSIS — R519 Headache, unspecified: Secondary | ICD-10-CM

## 2016-09-29 MED ORDER — TOPIRAMATE 25 MG PO TABS: 25.0000 mg | ORAL_TABLET | Freq: Every day | ORAL | 2 refills | Status: DC

## 2016-09-29 NOTE — Progress Notes (Signed)
BP 115/80   Pulse 86   Temp 98.2 F (36.8 C) (Oral)   Ht  (1.778 m)   Wt 210 lb (95.3 kg)   LMP 05/28/2011   BMI 30.13 kg/m    Subjective:    Patient ID: Courtney Brooks, female    DOB: 1970/01/10, 46 y.o.   MRN: [id number]  HPI: Courtney Brooks is a 46 y.o. female presenting on 09/29/2016 for Follow up headaches (headaches seem no better, saw Dr Darlyn Read on 9/10 who prescriibed Fioricet, seemed to improve but then came back again; also had normal MRI)   HPI  follow-up headaches Patient is coming in today for follow-up on her headaches. She saw Dr. Darlyn Read on 09/13/2016 who prescribed her Fioricet and had an MRI, the MRI showed no abnormalities and she still has a neurology referral pending. She says that her headaches have been coming about every other day and last for a day or 2 when they do come. She denies any visual disturbances or numbness or weakness or difficulty speaking or facial droops. She would like to try something else besides the Fioricet to see if it could help prevent migraines and headaches.  Relevant past medical, surgical, family and social history reviewed and updated as indicated. Interim medical history since our last visit reviewed. Allergies and medications reviewed and updated.  Review of Systems  Constitutional: Negative for chills and fever.  Eyes: Negative for visual disturbance.  Respiratory: Negative for chest tightness and shortness of breath.   Cardiovascular: Negative for chest pain and leg swelling.  Musculoskeletal: Negative for back pain and gait problem.  Skin: Negative for rash.  Neurological: Positive for headaches. Negative for dizziness and light-headedness.  Psychiatric/Behavioral: Negative for agitation and behavioral problems.  All other systems reviewed and are negative.   Per HPI unless specifically indicated above        Objective:    BP 115/80   Pulse 86   Temp 98.2 F (36.8 C) (Oral)   Ht  (1.778 m)   Wt 210 lb  (95.3 kg)   LMP 05/28/2011   BMI 30.13 kg/m   Wt Readings from Last 3 Encounters:  09/29/16 210 lb (95.3 kg)  09/13/16 208 lb (94.3 kg)  08/02/16 209 lb (94.8 kg)    Physical Exam  Constitutional: She is oriented to person, place, and time. She appears well-developed and well-nourished. No distress.  Eyes: Conjunctivae are normal.  Neck: Neck supple. No thyromegaly present.  Cardiovascular: Normal rate, regular rhythm, normal heart sounds and intact distal pulses.   No murmur heard. Pulmonary/Chest: Effort normal and breath sounds normal. No respiratory distress. She has no wheezes. She has no rales.  Musculoskeletal: Normal range of motion. She exhibits no edema or tenderness.  Lymphadenopathy:    She has no cervical adenopathy.  Neurological: She is alert and oriented to person, place, and time. Coordination normal.  Skin: Skin is warm and dry. No rash noted. She is not diaphoretic.  Psychiatric: She has a normal mood and affect. Her behavior is normal.  Nursing note and vitals reviewed.       Assessment & Plan:   Problem List Items Addressed This Visit    None    Visit Diagnoses    Chronic intractable headache, unspecified headache type    -  Primary   Relevant Medications   topiramate (TOPAMAX) 25 MG tablet       Follow up plan: Return if symptoms worsen or fail to improve.  Counseling provided for all of the vaccine components No orders of the defined types were placed in this encounter.   Joshua DettingerArville [facility] 09/29/2016, 4:34 PM

## 2016-11-02 ENCOUNTER — Encounter: Payer: Self-pay | Admitting: Family

## 2016-11-02 ENCOUNTER — Ambulatory Visit (INDEPENDENT_AMBULATORY_CARE_PROVIDER_SITE_OTHER): Payer: BLUE CROSS/BLUE SHIELD | Admitting: Family

## 2016-11-02 VITALS — BP 124/85 | HR 99 | Temp 98.3°F | Ht 70.0 in | Wt 218.0 lb

## 2016-11-02 DIAGNOSIS — M5442 Lumbago with sciatica, left side: Secondary | ICD-10-CM

## 2016-11-02 MED ORDER — NAPROXEN 500 MG PO TABS: 500.0000 mg | ORAL_TABLET | Freq: Two times a day (BID) | ORAL | 1 refills | Status: DC

## 2016-11-02 MED ORDER — CYCLOBENZAPRINE HCL 10 MG PO TABS: 10.0000 mg | ORAL_TABLET | Freq: Three times a day (TID) | ORAL | 0 refills | Status: DC | PRN

## 2016-11-02 MED ORDER — KETOROLAC TROMETHAMINE 60 MG/2ML IM SOLN
60.0000 mg | Freq: Once | INTRAMUSCULAR | Status: AC
  Administered 2016-11-02: 60 mg via INTRAMUSCULAR

## 2016-11-02 MED ORDER — METHYLPREDNISOLONE ACETATE 80 MG/ML IJ SUSP
80.0000 mg | Freq: Once | INTRAMUSCULAR | Status: AC
  Administered 2016-11-02: 80 mg via INTRAMUSCULAR

## 2016-11-02 NOTE — Progress Notes (Addendum)
   Subjective:    Patient ID: Courtney Brooks, female    DOB: 08/03/1970, 46 y.o.   MRN: [id number]  Back Pain  This is a recurrent problem. The current episode started in the past 7 days. The problem occurs intermittently. The problem has been gradually worsening since onset. The pain is present in the lumbar spine. The quality of the pain is described as aching. The pain radiates to the left thigh. The pain is at a severity of 7/10. The pain is moderate. The symptoms are aggravated by bending and standing. Associated symptoms include leg pain and tingling. She has tried bed rest and NSAIDs for the symptoms. The treatment provided mild relief.      Review of Systems  Musculoskeletal: Positive for back pain.  Neurological: Positive for tingling.  All other systems reviewed and are negative.      Objective:   Physical Exam  Constitutional: She is oriented to person, place, and time. She appears well-developed and well-nourished. No distress.  HENT:  Head: Normocephalic and atraumatic.  Right Ear: External ear normal.  Left Ear: External ear normal.  Mouth/Throat: Oropharynx is clear and moist.  Eyes: Pupils are equal, round, and reactive to light.  Neck: Normal range of motion. Neck supple. No thyromegaly present.  Cardiovascular: Normal rate, regular rhythm, normal heart sounds and intact distal pulses.   No murmur heard. Pulmonary/Chest: Effort normal and breath sounds normal. No respiratory distress. She has no wheezes.  Abdominal: Soft. Bowel sounds are normal. She exhibits no distension.  Musculoskeletal: She exhibits tenderness. She exhibits no edema.  Pain in lower gluteal area radiating down left leg with flexion, negative SLR  Neurological: She is alert and oriented to person, place, and time.  Skin: Skin is warm and dry.     Psychiatric: She has a normal mood and affect. Her behavior is normal. Judgment and thought content normal.  Vitals reviewed.    BP 124/85   Pulse  99   Temp 98.3 F (36.8 C) (Oral)   Ht 5\' 10"  (1.778 m)   Wt 218 lb (98.9 kg)   LMP 05/28/2011   BMI 31.28 kg/m      Assessment & Plan:  1. Acute left-sided low back pain with left-sided sciatica Rest ICe and heat ROM exercises discussed Keep appt with Neurosurgereon next week RTO prn or if symptoms do not improve  - ketorolac (TORADOL) injection 60 mg; Inject 2 mLs (60 mg total) into the muscle once. - methylPREDNISolone acetate (DEPO-MEDROL) injection 80 mg; Inject 1 mL (80 mg total) into the muscle once. - naproxen (NAPROSYN) 500 MG tablet; Take 1 tablet (500 mg total) by mouth 2 (two) times daily with a meal.  Dispense: 60 tablet; Refill: 1 - cyclobenzaprine (FLEXERIL) 10 MG tablet; Take 1 tablet (10 mg total) by mouth 3 (three) times daily as needed for muscle spasms.  Dispense: 30 tablet; Refill: 0   Jannifer Rodneyhristy Catelyn Friel, FNP

## 2016-11-02 NOTE — Patient Instructions (Signed)
Sciatica Sciatica is pain, numbness, weakness, or tingling along the path of the sciatic nerve. The sciatic nerve starts in the lower back and runs down the back of each leg. The nerve controls the muscles in the lower leg and in the back of the knee. It also provides feeling (sensation) to the back of the thigh, the lower leg, and the sole of the foot. Sciatica is a symptom of another medical condition that pinches or puts pressure on the sciatic nerve. Generally, sciatica only affects one side of the body. Sciatica usually goes away on its own or with treatment. In some cases, sciatica may keep coming back (recur). What are the causes? This condition is caused by pressure on the sciatic nerve, or pinching of the sciatic nerve. This may be the result of:  A disk in between the bones of the spine (vertebrae) bulging out too far (herniated disk).  Age-related changes in the spinal disks (degenerative disk disease).  A pain disorder that affects a muscle in the buttock (piriformis syndrome).  Extra bone growth (bone spur) near the sciatic nerve.  An injury or break (fracture) of the pelvis.  Pregnancy.  Tumor (rare). What increases the risk? The following factors may make you more likely to develop this condition:  Playing sports that place pressure or stress on the spine, such as football or weight lifting.  Having poor strength and flexibility.  A history of back injury.  A history of back surgery.  Sitting for long periods of time.  Doing activities that involve repetitive bending or lifting.  Obesity. What are the signs or symptoms? Symptoms can vary from mild to very severe, and they may include:  Any of these problems in the lower back, leg, hip, or buttock:  Mild tingling or dull aches.  Burning sensations.  Sharp pains.  Numbness in the back of the calf or the sole of the foot.  Leg weakness.  Severe back pain that makes movement difficult. These symptoms may  get worse when you cough, sneeze, or laugh, or when you sit or stand for long periods of time. Being overweight may also make symptoms worse. In some cases, symptoms may recur over time. How is this diagnosed? This condition may be diagnosed based on:  Your symptoms.  A physical exam. Your health care provider may ask you to do certain movements to check whether those movements trigger your symptoms.  You may have tests, including:  Blood tests.  X-rays.  MRI.  CT scan. How is this treated? In many cases, this condition improves on its own, without any treatment. However, treatment may include:  Reducing or modifying physical activity during periods of pain.  Exercising and stretching to strengthen your abdomen and improve the flexibility of your spine.  Icing and applying heat to the affected area.  Medicines that help:  To relieve pain and swelling.  To relax your muscles.  Injections of medicines that help to relieve pain, irritation, and inflammation around the sciatic nerve (steroids).  Surgery. Follow these instructions at home: Medicines   Take over-the-counter and prescription medicines only as told by your health care provider.  Do not drive or operate heavy machinery while taking prescription pain medicine. Managing pain   If directed, apply ice to the affected area.  Put ice in a plastic bag.  Place a towel between your skin and the bag.  Leave the ice on for 20 minutes, 2-3 times a day.  After icing, apply heat to the   affected area before you exercise or as often as told by your health care provider. Use the heat source that your health care provider recommends, such as a moist heat pack or a heating pad.  Place a towel between your skin and the heat source.  Leave the heat on for 20-30 minutes.  Remove the heat if your skin turns bright red. This is especially important if you are unable to feel pain, heat, or cold. You may have a greater risk of  getting burned. Activity   Return to your normal activities as told by your health care provider. Ask your health care provider what activities are safe for you.  Avoid activities that make your symptoms worse.  Take brief periods of rest throughout the day. Resting in a lying or standing position is usually better than sitting to rest.  When you rest for longer periods, mix in some mild activity or stretching between periods of rest. This will help to prevent stiffness and pain.  Avoid sitting for long periods of time without moving. Get up and move around at least one time each hour.  Exercise and stretch regularly, as told by your health care provider.  Do not lift anything that is heavier than 10 lb (4.5 kg) while you have symptoms of sciatica. When you do not have symptoms, you should still avoid heavy lifting, especially repetitive heavy lifting.  When you lift objects, always use proper lifting technique, which includes:  Bending your knees.  Keeping the load close to your body.  Avoiding twisting. General instructions   Use good posture.  Avoid leaning forward while sitting.  Avoid hunching over while standing.  Maintain a healthy weight. Excess weight puts extra stress on your back and makes it difficult to maintain good posture.  Wear supportive, comfortable shoes. Avoid wearing high heels.  Avoid sleeping on a mattress that is too soft or too hard. A mattress that is firm enough to support your back when you sleep may help to reduce your pain.  Keep all follow-up visits as told by your health care provider. This is important. Contact a health care provider if:  You have pain that wakes you up when you are sleeping.  You have pain that gets worse when you lie down.  Your pain is worse than you have experienced in the past.  Your pain lasts longer than 4 weeks.  You experience unexplained weight loss. Get help right away if:  You lose control of your bowel  or bladder (incontinence).  You have:  Weakness in your lower back, pelvis, buttocks, or legs that gets worse.  Redness or swelling of your back.  A burning sensation when you urinate. This information is not intended to replace advice given to you by your health care provider. Make sure you discuss any questions you have with your health care provider. Document Released: 12/15/2000 Document Revised: 05/27/2015 Document Reviewed: 08/30/2014 Elsevier Interactive Patient Education  2017 Elsevier Inc.  

## 2016-11-09 DIAGNOSIS — M5442 Lumbago with sciatica, left side: Secondary | ICD-10-CM | POA: Diagnosis not present

## 2016-11-09 DIAGNOSIS — Z6830 Body mass index (BMI) 30.0-30.9, adult: Secondary | ICD-10-CM | POA: Diagnosis not present

## 2016-11-12 ENCOUNTER — Other Ambulatory Visit: Payer: Self-pay | Admitting: Neurosurgery

## 2016-11-12 DIAGNOSIS — M5442 Lumbago with sciatica, left side: Secondary | ICD-10-CM

## 2016-11-24 ENCOUNTER — Ambulatory Visit
Admission: RE | Admit: 2016-11-24 | Discharge: 2016-11-24 | Disposition: A | Discharge status: Home / Self Care | Payer: BLUE CROSS/BLUE SHIELD | Source: Ambulatory Visit | Attending: Neurosurgery | Admitting: Neurosurgery

## 2016-11-24 DIAGNOSIS — M5442 Lumbago with sciatica, left side: Secondary | ICD-10-CM

## 2016-11-24 DIAGNOSIS — M5126 Other intervertebral disc displacement, lumbar region: Secondary | ICD-10-CM | POA: Diagnosis not present

## 2016-11-24 MED ORDER — GADOBENATE DIMEGLUMINE 529 MG/ML IV SOLN: 20.0000 mL | Freq: Once | INTRAVENOUS | Status: DC | PRN

## 2016-12-07 DIAGNOSIS — M5442 Lumbago with sciatica, left side: Secondary | ICD-10-CM | POA: Diagnosis not present

## 2016-12-07 DIAGNOSIS — R030 Elevated blood-pressure reading, without diagnosis of hypertension: Secondary | ICD-10-CM | POA: Diagnosis not present

## 2016-12-07 DIAGNOSIS — Z6830 Body mass index (BMI) 30.0-30.9, adult: Secondary | ICD-10-CM | POA: Diagnosis not present

## 2016-12-09 ENCOUNTER — Other Ambulatory Visit (HOSPITAL_COMMUNITY): Payer: Self-pay | Admitting: Neurosurgery

## 2016-12-09 ENCOUNTER — Other Ambulatory Visit: Payer: Self-pay | Admitting: Neurosurgery

## 2016-12-09 DIAGNOSIS — M5442 Lumbago with sciatica, left side: Secondary | ICD-10-CM

## 2016-12-23 ENCOUNTER — Ambulatory Visit: Payer: Self-pay | Admitting: Neurology

## 2016-12-24 ENCOUNTER — Ambulatory Visit (HOSPITAL_COMMUNITY)
Admission: RE | Admit: 2016-12-24 | Discharge: 2016-12-24 | Disposition: A | Discharge status: Home / Self Care | Payer: BLUE CROSS/BLUE SHIELD | Source: Ambulatory Visit | Attending: Neurosurgery | Admitting: Neurosurgery

## 2016-12-24 ENCOUNTER — Encounter (HOSPITAL_COMMUNITY): Payer: Self-pay

## 2016-12-24 DIAGNOSIS — M48061 Spinal stenosis, lumbar region without neurogenic claudication: Secondary | ICD-10-CM | POA: Diagnosis not present

## 2016-12-24 DIAGNOSIS — E882 Lipomatosis, not elsewhere classified: Secondary | ICD-10-CM | POA: Insufficient documentation

## 2016-12-24 DIAGNOSIS — M5442 Lumbago with sciatica, left side: Secondary | ICD-10-CM

## 2016-12-24 DIAGNOSIS — M5134 Other intervertebral disc degeneration, thoracic region: Secondary | ICD-10-CM | POA: Insufficient documentation

## 2016-12-24 DIAGNOSIS — M5116 Intervertebral disc disorders with radiculopathy, lumbar region: Secondary | ICD-10-CM | POA: Diagnosis not present

## 2016-12-24 DIAGNOSIS — M5416 Radiculopathy, lumbar region: Secondary | ICD-10-CM | POA: Diagnosis not present

## 2016-12-24 MED ORDER — DIAZEPAM 5 MG PO TABS
ORAL_TABLET | ORAL | Status: AC
  Filled 2016-12-24: qty 2

## 2016-12-24 MED ORDER — DIAZEPAM 5 MG PO TABS
10.0000 mg | ORAL_TABLET | Freq: Once | ORAL | Status: AC
  Administered 2016-12-24: 10 mg via ORAL
  Filled 2016-12-24: qty 2

## 2016-12-24 MED ORDER — LIDOCAINE HCL (PF) 1 % IJ SOLN
5.0000 mL | Freq: Once | INTRAMUSCULAR | Status: AC
  Administered 2016-12-24: 5 mL via INTRADERMAL

## 2016-12-24 MED ORDER — ONDANSETRON HCL 4 MG/2ML IJ SOLN: 4.0000 mg | Freq: Four times a day (QID) | INTRAMUSCULAR | Status: DC | PRN

## 2016-12-24 MED ORDER — IOPAMIDOL (ISOVUE-M 200) INJECTION 41%
20.0000 mL | Freq: Once | INTRAMUSCULAR | Status: AC
  Administered 2016-12-24: 20 mL via INTRATHECAL

## 2016-12-24 NOTE — Op Note (Signed)
*   No surgery found * Lumbar Myelogram  PATIENT:  Courtney Brooks is a 46 y.o. female  PRE-OPERATIVE DIAGNOSIS:  Left lumbar radiculopathy  POST-OPERATIVE DIAGNOSIS:  same  PROCEDURE:  Lumbar Myelogram  SURGEON:  Clifton Kovacic  ANESTHESIA:   local LOCAL MEDICATIONS USED:  LIDOCAINE  and Amount: 5 ml Procedure Note: Courtney Brooks is a 46 y.o. female Was taken to the fluoroscopy suite and  positioned prone on the fluoroscopy table. Her back was prepared and draped in a sterile manner. I infiltrated 5 cc lidocaine into the lumbar region. I then introduced a spinal needle into the thecal sac at the L2/3 interlaminar space. I infiltrated 20cc of Isovue [id number] into the thecal sac. Fluoroscopy showed the needle and contrast in the thecal sac. Courtney Brooks tolerated the procedure well. she Will be taken to CT for evaluation.     PATIENT DISPOSITION:  Short Stay

## 2016-12-24 NOTE — Discharge Instructions (Signed)
Myelogram, Care After °Refer to this sheet in the next few weeks. These instructions provide you with information about caring for yourself after your procedure. Your health care provider may also give you more specific instructions. Your treatment has been planned according to current medical practices, but problems sometimes occur. Call your health care provider if you have any problems or questions after your procedure. °What can I expect after the procedure? °After the procedure, it is common to have: °· Soreness at your injection site. °· A mild headache. ° °Follow these instructions at home: °· Drink enough fluid to keep your urine clear or pale yellow. This will help flush out the dye (contrast material) from your spine. °· Rest as told by your health care provider. Lie flat with your head slightly raised (elevated) to reduce the risk of headache. °· Do not bend, lift, or do any strenuous activity for 24-48 hours or as told by your health care provider. °· Take over-the-counter and prescription medicines only as told by your health care provider. °· Take care of and remove your bandage (dressing) as told by your health care provider. °· Bathe or shower as told by your health care provider. °Contact a health care provider if: °· You have a fever. °· You have a headache that lasts longer than 24 hours. °· You feel nauseous or vomit. °· You have a stiff neck or numbness in your legs. °· You are unable to urinate or have a bowel movement. °· You develop a rash, itching, or sneezing. °Get help right away if: °· You have new symptoms or your symptoms get worse. °· You have a seizure. °· You have trouble breathing. °This information is not intended to replace advice given to you by your health care provider. Make sure you discuss any questions you have with your health care provider. °Document Released: 01/17/2015 Document Revised: 05/29/2015 Document Reviewed: 10/03/2014 °Elsevier Interactive Patient Education ©  2018 Elsevier Inc. ° °

## 2016-12-29 ENCOUNTER — Other Ambulatory Visit: Payer: Self-pay | Admitting: Neurosurgery

## 2016-12-29 ENCOUNTER — Ambulatory Visit
Admission: RE | Admit: 2016-12-29 | Discharge: 2016-12-29 | Disposition: A | Discharge status: Home / Self Care | Payer: BLUE CROSS/BLUE SHIELD | Source: Ambulatory Visit | Attending: Neurosurgery | Admitting: Neurosurgery

## 2016-12-29 DIAGNOSIS — G971 Other reaction to spinal and lumbar puncture: Secondary | ICD-10-CM

## 2016-12-29 MED ORDER — IOPAMIDOL (ISOVUE-M 200) INJECTION 41%
1.0000 mL | Freq: Once | INTRAMUSCULAR | Status: AC
  Administered 2016-12-29: 1 mL via EPIDURAL

## 2016-12-29 NOTE — Discharge Instructions (Signed)

## 2016-12-30 DIAGNOSIS — M5442 Lumbago with sciatica, left side: Secondary | ICD-10-CM | POA: Diagnosis not present

## 2016-12-30 DIAGNOSIS — Z6831 Body mass index (BMI) 31.0-31.9, adult: Secondary | ICD-10-CM | POA: Diagnosis not present

## 2017-01-21 DIAGNOSIS — R200 Anesthesia of skin: Secondary | ICD-10-CM | POA: Diagnosis not present

## 2017-01-21 DIAGNOSIS — M5126 Other intervertebral disc displacement, lumbar region: Secondary | ICD-10-CM | POA: Diagnosis not present

## 2017-01-21 DIAGNOSIS — M79605 Pain in left leg: Secondary | ICD-10-CM | POA: Diagnosis not present

## 2017-01-21 DIAGNOSIS — M5416 Radiculopathy, lumbar region: Secondary | ICD-10-CM | POA: Diagnosis not present

## 2017-01-25 DIAGNOSIS — M5416 Radiculopathy, lumbar region: Secondary | ICD-10-CM | POA: Diagnosis not present

## 2017-01-27 ENCOUNTER — Other Ambulatory Visit: Payer: Self-pay

## 2017-01-27 DIAGNOSIS — G43919 Migraine, unspecified, intractable, without status migrainosus: Secondary | ICD-10-CM

## 2017-02-09 DIAGNOSIS — Z6831 Body mass index (BMI) 31.0-31.9, adult: Secondary | ICD-10-CM | POA: Diagnosis not present

## 2017-02-09 DIAGNOSIS — M461 Sacroiliitis, not elsewhere classified: Secondary | ICD-10-CM | POA: Diagnosis not present

## 2017-02-09 DIAGNOSIS — R030 Elevated blood-pressure reading, without diagnosis of hypertension: Secondary | ICD-10-CM | POA: Diagnosis not present

## 2017-03-07 DIAGNOSIS — Z6837 Body mass index (BMI) 37.0-37.9, adult: Secondary | ICD-10-CM | POA: Diagnosis not present

## 2017-03-07 DIAGNOSIS — M79605 Pain in left leg: Secondary | ICD-10-CM | POA: Diagnosis not present

## 2017-03-07 DIAGNOSIS — R030 Elevated blood-pressure reading, without diagnosis of hypertension: Secondary | ICD-10-CM | POA: Diagnosis not present

## 2017-05-23 DIAGNOSIS — M545 Low back pain: Secondary | ICD-10-CM | POA: Diagnosis not present

## 2017-05-23 DIAGNOSIS — M5116 Intervertebral disc disorders with radiculopathy, lumbar region: Secondary | ICD-10-CM | POA: Diagnosis not present

## 2017-06-14 DIAGNOSIS — M5116 Intervertebral disc disorders with radiculopathy, lumbar region: Secondary | ICD-10-CM | POA: Diagnosis not present

## 2017-06-28 DIAGNOSIS — M5116 Intervertebral disc disorders with radiculopathy, lumbar region: Secondary | ICD-10-CM | POA: Diagnosis not present

## 2017-07-20 DIAGNOSIS — M5116 Intervertebral disc disorders with radiculopathy, lumbar region: Secondary | ICD-10-CM | POA: Diagnosis not present

## 2017-08-09 ENCOUNTER — Ambulatory Visit (INDEPENDENT_AMBULATORY_CARE_PROVIDER_SITE_OTHER): Payer: BLUE CROSS/BLUE SHIELD | Admitting: Family Medicine

## 2017-08-09 ENCOUNTER — Encounter: Payer: Self-pay | Admitting: Family Medicine

## 2017-08-09 VITALS — BP 125/86 | HR 94 | Temp 97.3°F | Ht 70.0 in | Wt 210.0 lb

## 2017-08-09 DIAGNOSIS — B9789 Other viral agents as the cause of diseases classified elsewhere: Secondary | ICD-10-CM | POA: Diagnosis not present

## 2017-08-09 DIAGNOSIS — J069 Acute upper respiratory infection, unspecified: Secondary | ICD-10-CM

## 2017-08-09 NOTE — Patient Instructions (Signed)
It appears that you have a viral upper respiratory infection (cold).  Cold symptoms can last up to 2 weeks.    - Get plenty of rest and drink plenty of fluids. - Try to breathe moist air. Use a cold mist humidifier. - Consume warm fluids (soup or tea) to provide relief for a stuffy nose and to loosen phlegm. - For nasal stuffiness, try saline nasal spray or a Neti Pot. Afrin nasal spray can also be used but this product should not be used longer than 3 days or it will cause rebound nasal stuffiness (worsening nasal congestion). - For sore throat pain relief: use chloraseptic spray, suck on throat lozenges, hard candy or popsicles; gargle with warm salt water (1/4 tsp. salt per 8 oz. of water); and eat soft, bland foods. - Eat a well-balanced diet. If you cannot, ensure you are getting enough nutrients by taking a daily multivitamin. - Avoid dairy products, as they can thicken phlegm. - Avoid alcohol, as it impairs your body's immune system.  CONTACT YOUR DOCTOR IF YOU EXPERIENCE ANY OF THE FOLLOWING: - High fever - Ear pain - Sinus-type headache - Unusually severe cold symptoms - Cough that gets worse while other cold symptoms improve - Flare up of any chronic lung problem, such as asthma - Your symptoms persist longer than 2 weeks   Sinus Rinse What is a sinus rinse? A sinus rinse is a home treatment. It rinses your sinuses with a mixture of salt and water (saline solution). Sinuses are air-filled spaces in your skull behind the bones of your face and forehead. They open into your nasal cavity. To do a sinus rinse, you will need:  Saline solution.  Neti pot or spray bottle. This releases the saline solution into your nose and through your sinuses. You can buy neti pots and spray bottles at: ? [employer]. ? A health food store. ? Online.  When should I do a sinus rinse? A sinus rinse can help to clear your nasal cavity. It can  clear:  Mucus.  Dirt.  Dust.  Pollen.  You may do a sinus rinse when you have:  A cold.  A virus.  Allergies.  A sinus infection.  A stuffy nose.  If you are considering a sinus rinse:  Ask your child's doctor before doing a sinus rinse on your child.  Do not do a sinus rinse if you have had: ? Ear or nasal surgery. ? An ear infection. ? Blocked ears.  How do I do a sinus rinse?  Wash your hands.  Disinfect your device using the directions that came with the device.  Dry your device.  Use the solution that comes with your device or one that is sold separately in stores. Follow the mixing directions on the package.  Fill your device with the amount of saline solution as stated in the device instructions.  Stand over a sink and tilt your head sideways over the sink.  Place the spout of the device in your upper nostril (the one closer to the ceiling).  Gently pour or squeeze the saline solution into the nasal cavity. The liquid should drain to the lower nostril if you are not too congested.  Gently blow your nose. Blowing too hard may cause ear pain.  Repeat in the other nostril.  Clean and rinse your device with clean water.  Air-dry your device. Are there risks of a sinus rinse? Sinus rinse is normally very safe and helpful. However, there  are a few risks, which include:  A burning feeling in the sinuses. This may happen if you do not make the saline solution as instructed. Make sure to follow all directions when making the saline solution.  Infection from unclean water. This is rare, but possible.  Nasal irritation.  This information is not intended to replace advice given to you by your health care provider. Make sure you discuss any questions you have with your health care provider. Document Released: 07/18/2013 Document Revised: 11/18/2015 Document Reviewed: 05/08/2013 Elsevier Interactive Patient Education  2017 [employer]

## 2017-08-09 NOTE — Progress Notes (Signed)
Subjective: CC: sinusitis PCP: Dettinger, Fransisca Kaufmann, MD HPI:Courtney Brooks is a 47 y.o. female presenting to clinic today for:  1. Sinusitis Patient reports a 1 day history of frontal headache, sinus drainage and ocular discharge with awakening.  She reports frequent sneezing and a mild nonproductive cough.  No associated fevers, nausea, vomiting, purulence from nares.  No shortness of breath or wheeze.  She notes some chills.  She has used Alka-Seltzer cold and sinus which has not helped a tremendous amount.  Not on any oral antihistamines.  Not using any Sudafed or sinus rinses.  No known sick contacts.  No history of COPD or asthma.   ROS: Per HPI  Allergies  Allergen Reactions  . Percocet [Oxycodone-Acetaminophen] Itching   Past Medical History:  Diagnosis Date  . Endometriosis   . Family history of breast cancer    [facility] testing letter sent 2017  . GERD (gastroesophageal reflux disease)   . Hx: UTI (urinary tract infection)     Current Outpatient Medications:  .  fluticasone (FLONASE) 50 MCG/ACT nasal spray, Place 2 sprays into both nostrils daily. (Patient taking differently: Place 2 sprays into both nostrils as needed for allergies. ), Disp: 16 g, Rfl: 6 .  tizanidine (ZANAFLEX) 2 MG capsule, Take 2 mg by mouth 3 (three) times daily as needed for muscle spasms., Disp: , Rfl:  Social History   Socioeconomic History  . Marital status: Married    Spouse name: Not on file  . Number of children: 2  . Years of education: Not on file  . Highest education level: Not on file  Occupational History  . Occupation: Educational psychologist  Social Needs  . Financial resource strain: Not on file  . Food insecurity:    Worry: Not on file    Inability: Not on file  . Transportation needs:    Medical: Not on file    Non-medical: Not on file  Tobacco Use  . Smoking status: Never Smoker  . Smokeless tobacco: Never Used  Substance and Sexual Activity  . Alcohol use: Yes   Alcohol/week: 2.4 oz    Types: 4 drink(s) per week    Comment: occ  . Drug use: No  . Sexual activity: Not on file  Lifestyle  . Physical activity:    Days per week: Not on file    Minutes per session: Not on file  . Stress: Not on file  Relationships  . Social connections:    Talks on phone: Not on file    Gets together: Not on file    Attends religious service: Not on file    Active member of club or organization: Not on file    Attends meetings of clubs or organizations: Not on file    Relationship status: Not on file  . Intimate partner violence:    Fear of current or ex partner: Not on file    Emotionally abused: Not on file    Physically abused: Not on file    Forced sexual activity: Not on file  Other Topics Concern  . Not on file  Social History Narrative   Daily caffeine    Family History  Problem Relation Age of Onset  . Hypertension Mother   . Kidney disease Mother   . Diabetes Mother   . Heart disease Father   . Breast cancer Paternal Grandmother 30  . Lung cancer Maternal Grandmother   . Hypertension Maternal Grandmother   . Colon cancer Neg Hx  Objective: Office vital signs reviewed. BP 125/86   Pulse 94   Temp (!) 97.3 F (36.3 C) (Oral)   Ht 5' 10"  (1.778 m)   Wt 210 lb (95.3 kg)   BMI 30.13 kg/m   Physical Examination:  General: Awake, alert, well nourished, tired appearing but nontoxic. No acute distress HEENT: Normal    Neck: No masses palpated. No lymphadenopathy    Ears: Tympanic membranes intact, normal light reflex, no erythema, no bulging    Eyes: PERRLA, extraocular membranes intact, sclera white    Nose: nasal turbinates moist, clear nasal discharge    Throat: moist mucus membranes, no erythema, no tonsillar exudate.  Airway is patent Cardio: regular rate and rhythm, S1S2 heard, no murmurs appreciated Pulm: clear to auscultation bilaterally, no wheezes, rhonchi or rales; normal work of breathing on room air  Assessment/  Plan: 47 y.o. female   1. Viral URI with cough Patient is afebrile and nontoxic.  Her physical exam was unremarkable.  I suspect that this is a viral process.  Supportive care recommended.  AVS provided and reviewed with patient.  Reasons for return evaluation discussed.  If symptoms have not substantially improved with supportive care and rest at home by Friday, she will contact the office and we can consider empiric antibiotics at that time.  Would consider Augmentin to cover for bacterial sinus infection.  Work note provided.  She will follow-up as needed.  Janora Norlander, DO [place] [phone]

## 2017-09-06 DIAGNOSIS — M5430 Sciatica, unspecified side: Secondary | ICD-10-CM | POA: Diagnosis not present

## 2017-09-06 DIAGNOSIS — M5126 Other intervertebral disc displacement, lumbar region: Secondary | ICD-10-CM | POA: Diagnosis not present

## 2017-09-06 DIAGNOSIS — F419 Anxiety disorder, unspecified: Secondary | ICD-10-CM | POA: Diagnosis not present

## 2017-09-06 DIAGNOSIS — F4542 Pain disorder with related psychological factors: Secondary | ICD-10-CM | POA: Diagnosis not present

## 2017-10-14 DIAGNOSIS — Z1231 Encounter for screening mammogram for malignant neoplasm of breast: Secondary | ICD-10-CM | POA: Diagnosis not present

## 2017-10-14 LAB — HM MAMMOGRAPHY

## 2017-10-17 ENCOUNTER — Encounter: Payer: Self-pay | Admitting: Obstetrics and Gynecology

## 2017-10-17 NOTE — Progress Notes (Signed)
[facility] mammogram 10/14/2017 [facility]

## 2017-10-18 ENCOUNTER — Ambulatory Visit (INDEPENDENT_AMBULATORY_CARE_PROVIDER_SITE_OTHER): Payer: BLUE CROSS/BLUE SHIELD | Admitting: Family Medicine

## 2017-10-18 ENCOUNTER — Encounter: Payer: Self-pay | Admitting: Family Medicine

## 2017-10-18 VITALS — BP 136/82 | HR 81 | Temp 97.4°F | Ht 70.0 in | Wt 207.0 lb

## 2017-10-18 DIAGNOSIS — J0110 Acute frontal sinusitis, unspecified: Secondary | ICD-10-CM

## 2017-10-18 NOTE — Progress Notes (Signed)
BP 136/82   Pulse 81   Temp (!) 97.4 F (36.3 C) (Oral)   Ht 5\' 10"  (1.778 m)   Wt 207 lb (93.9 kg)   BMI 29.70 kg/m    Subjective:    Patient ID: Courtney Brooks, female    DOB: [birth year], 47 y.o.   MRN: [id number]  HPI: Courtney Brooks is a 47 y.o. female presenting on 10/18/2017 for Right ear pain, headache, sinus drainage, sore throat (since yesterday)   HPI Sinus congestion Patient comes in complaining of sinus congestion and headache and ear pain and sore throat that started yesterday.  She thinks there is a possibility she could have been exposed to strep and wanted to come get it checked out.  She feels like the sinus pressure and congestion has been keeping her up overnight and that is why she is coming in today.  She denies any fevers or chills or shortness of breath or wheezing.  She has not really used anything over-the-counter as to this point.  She is also coming in because she is planning on going on a trip in the next couple days and does not want to be sick when she is around her grandchild down in [place].  Relevant past medical, surgical, family and social history reviewed and updated as indicated. Interim medical history since our last visit reviewed. Allergies and medications reviewed and updated.  Review of Systems  Constitutional: Negative for chills and fever.  HENT: Positive for congestion, ear pain, postnasal drip, rhinorrhea, sinus pressure, sneezing and sore throat. Negative for ear discharge.   Eyes: Negative for pain, redness and visual disturbance.  Respiratory: Positive for cough. Negative for chest tightness and shortness of breath.   Cardiovascular: Negative for chest pain and leg swelling.  Genitourinary: Negative for difficulty urinating and dysuria.  Musculoskeletal: Negative for back pain and gait problem.  Skin: Negative for rash.  Neurological: Negative for light-headedness and headaches.  Psychiatric/Behavioral: Negative for agitation and  behavioral problems.  All other systems reviewed and are negative.   Per HPI unless specifically indicated above   Allergies as of 10/18/2017      Reactions   Percocet [oxycodone-acetaminophen] Itching      Medication List        Accurate as of 10/18/17  6:25 PM. Always use your most recent med list.          fluticasone 50 MCG/ACT nasal spray Commonly known as:  FLONASE Place 2 sprays into both nostrils daily.   tizanidine 2 MG capsule Commonly known as:  ZANAFLEX Take 2 mg by mouth 3 (three) times daily as needed for muscle spasms.          Objective:    BP 136/82   Pulse 81   Temp (!) 97.4 F (36.3 C) (Oral)   Ht 5\' 10"  (1.778 m)   Wt 207 lb (93.9 kg)   BMI 29.70 kg/m   Wt Readings from Last 3 Encounters:  10/18/17 207 lb (93.9 kg)  08/09/17 210 lb (95.3 kg)  12/24/16 200 lb (90.7 kg)    Physical Exam  Constitutional: She is oriented to person, place, and time. She appears well-developed and well-nourished. No distress.  HENT:  Right Ear: Tympanic membrane, external ear and ear canal normal.  Left Ear: Tympanic membrane, external ear and ear canal normal.  Nose: Mucosal edema and rhinorrhea present. No epistaxis. Right sinus exhibits no maxillary sinus tenderness and no frontal sinus tenderness. Left sinus exhibits no maxillary  sinus tenderness and no frontal sinus tenderness.  Mouth/Throat: Uvula is midline and mucous membranes are normal. Posterior oropharyngeal edema and posterior oropharyngeal erythema present. No oropharyngeal exudate or tonsillar abscesses.  Eyes: Conjunctivae and EOM are normal.  Cardiovascular: Normal rate, regular rhythm, normal heart sounds and intact distal pulses.  No murmur heard. Pulmonary/Chest: Effort normal and breath sounds normal. No respiratory distress. She has no wheezes.  Musculoskeletal: Normal range of motion. She exhibits no edema or tenderness.  Neurological: She is alert and oriented to person, place, and time.  Coordination normal.  Skin: Skin is warm and dry. No rash noted. She is not diaphoretic.  Psychiatric: She has a normal mood and affect. Her behavior is normal.  Vitals reviewed.       Assessment & Plan:   Problem List Items Addressed This Visit    None    Visit Diagnoses    Acute non-recurrent frontal sinusitis    -  Primary      And azithromycin and recommended Flonase for the patient as well. Follow up plan: Return if symptoms worsen or fail to improve.  Counseling provided for all of the vaccine components No orders of the defined types were placed in this encounter.   Arville Care, MD [facility] 10/18/2017, 6:25 PM

## 2017-10-20 ENCOUNTER — Telehealth: Payer: Self-pay | Admitting: Family Medicine

## 2017-10-20 MED ORDER — AZITHROMYCIN 250 MG PO TABS: ORAL_TABLET | ORAL | 0 refills | Status: DC

## 2017-10-20 NOTE — Telephone Encounter (Signed)
Patient aware.

## 2017-10-20 NOTE — Telephone Encounter (Signed)
Please let patient know that I called in azithromycin for

## 2017-10-30 IMAGING — RF DG MYELOGRAM LUMBAR
7 series · 7 of 7 positions shown · non-contrast
Comparison: Lumbar spine MRI 01/08/2015.  Myelogram 09/23/2014

CLINICAL DATA: Herniated lumbar intervertebral disc. Left posterior
hip pain. Left anterior thigh numbness. Tingling on the left.

EXAM:
LUMBAR MYELOGRAM
FLUOROSCOPY TIME:  36 seconds.  32.1 mGy airKerma
PROCEDURE:
Lumbar puncture and intrathecal contrast administration were
performed by Dr Wilder who will separately report for the portion
of the procedure. I personally supervised acquisition of the
myelogram images.
TECHNIQUE: Contiguous axial images were obtained through the Lumbar spine after
the intrathecal infusion of infusion. Coronal and sagittal
reconstructions were obtained of the axial image sets.

[Series 1: cp_standard · 0.18mm/px · 1 of 1 slices shown]
[im 1/1]
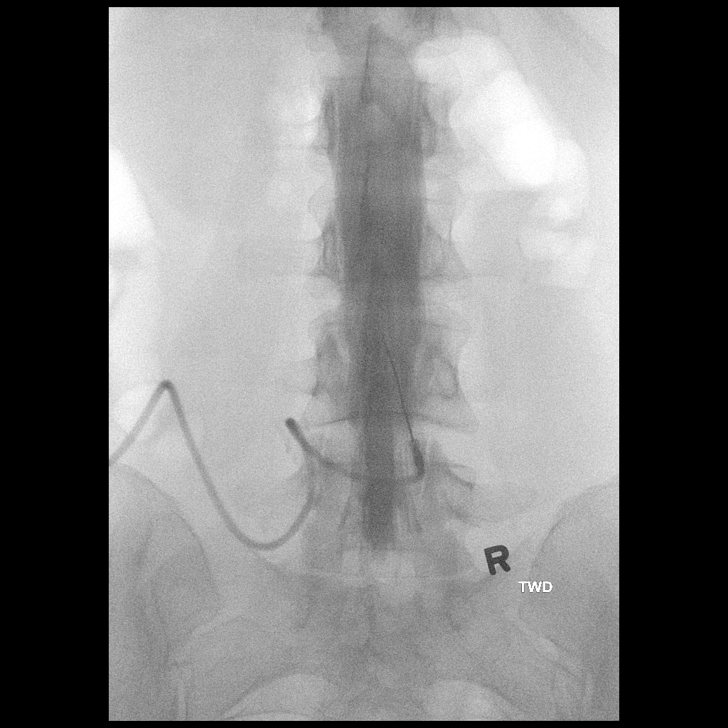

[Series 2: fluoro_myelogram_singleshot_bw · 0.18mm/px · 1 of 1 slices shown (1 of 6)]
[im 1/1]
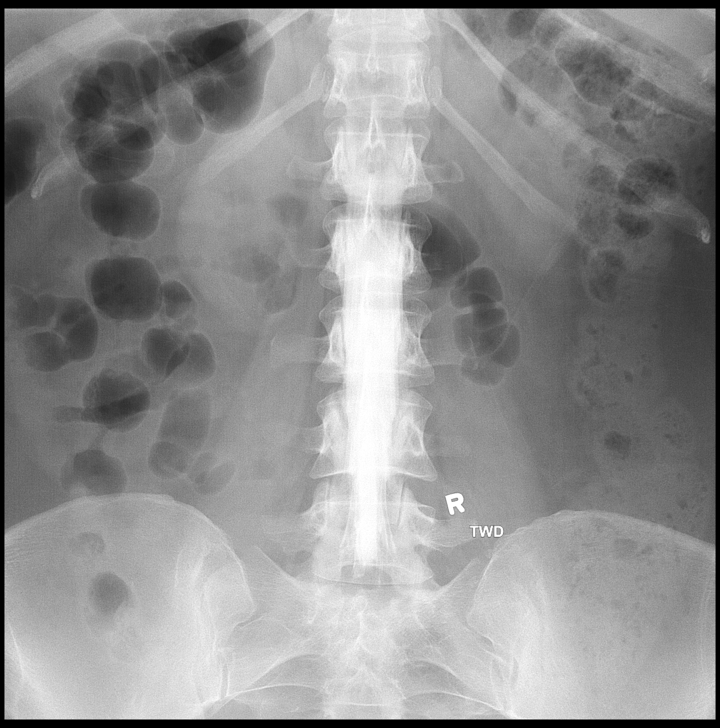

[Series 3: fluoro_myelogram_singleshot_bw · 0.18mm/px · 1 of 1 slices shown (2 of 6)]
[im 1/1]
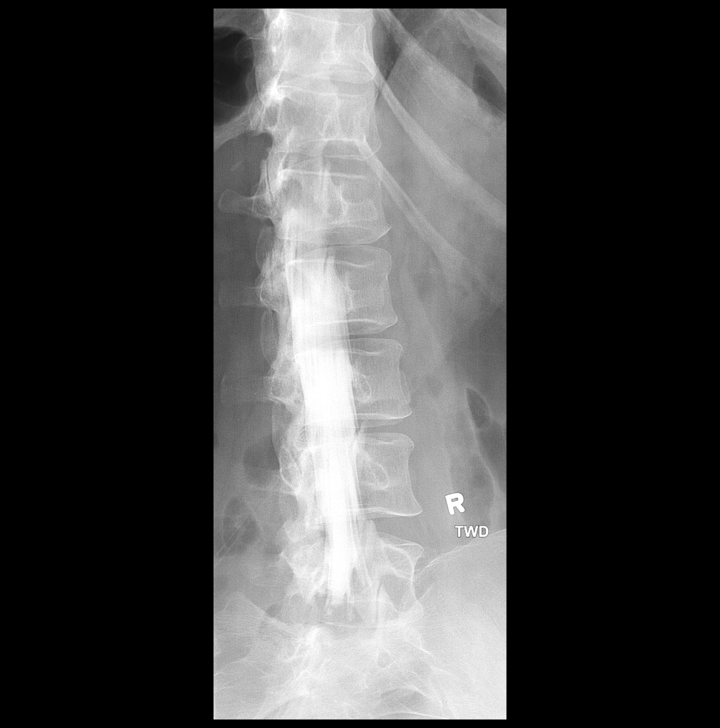

[Series 4: fluoro_myelogram_singleshot_bw · 0.18mm/px · 1 of 1 slices shown (3 of 6)]
[im 1/1]
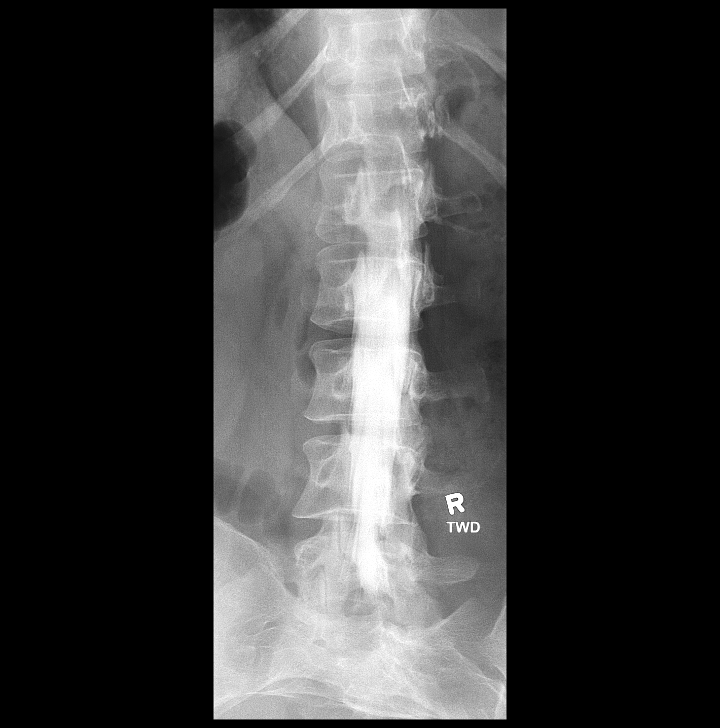

[Series 5: fluoro_myelogram_singleshot_bw · 0.18mm/px · 1 of 1 slices shown (4 of 6)]
[im 1/1]
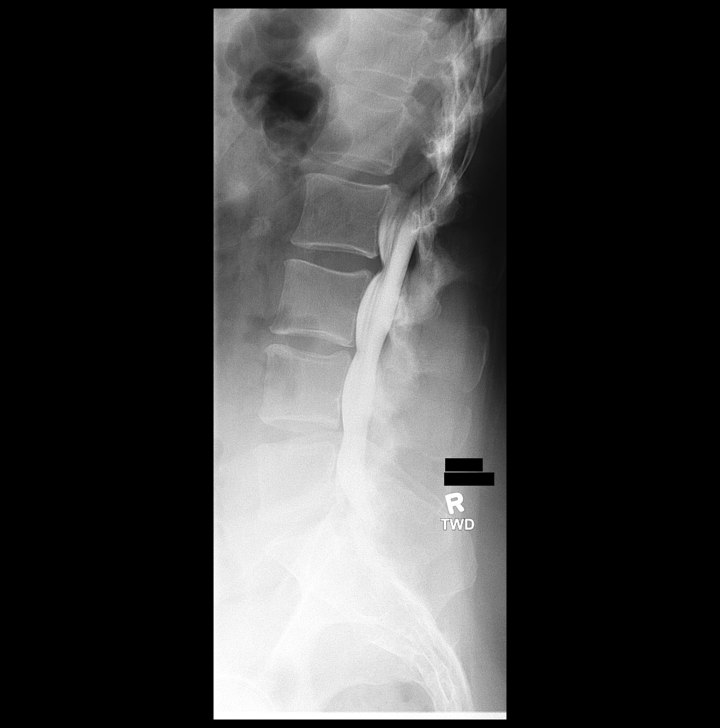

[Series 6: fluoro_myelogram_singleshot_bw · 0.18mm/px · 1 of 1 slices shown (5 of 6)]
[im 1/1]
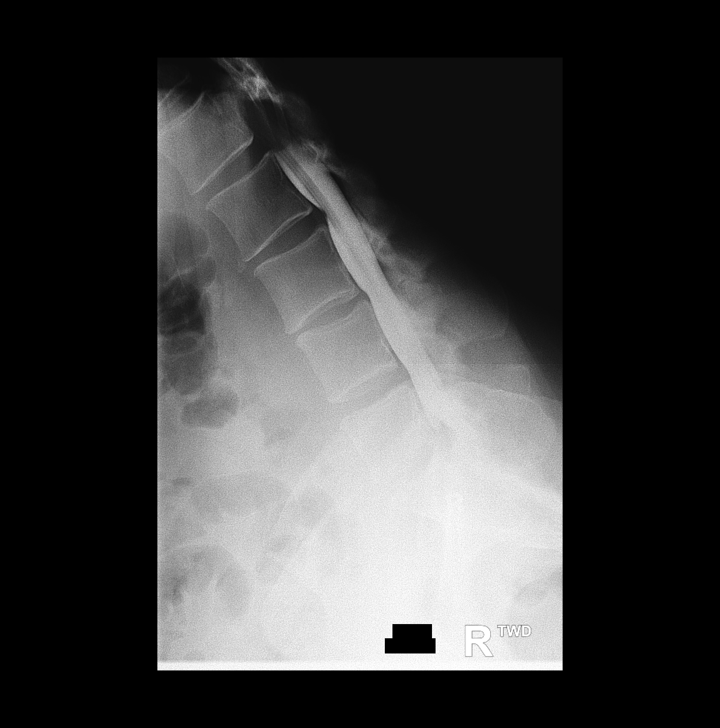

[Series 7: fluoro_myelogram_singleshot_bw · 0.18mm/px · 1 of 1 slices shown (6 of 6)]
[im 1/1]
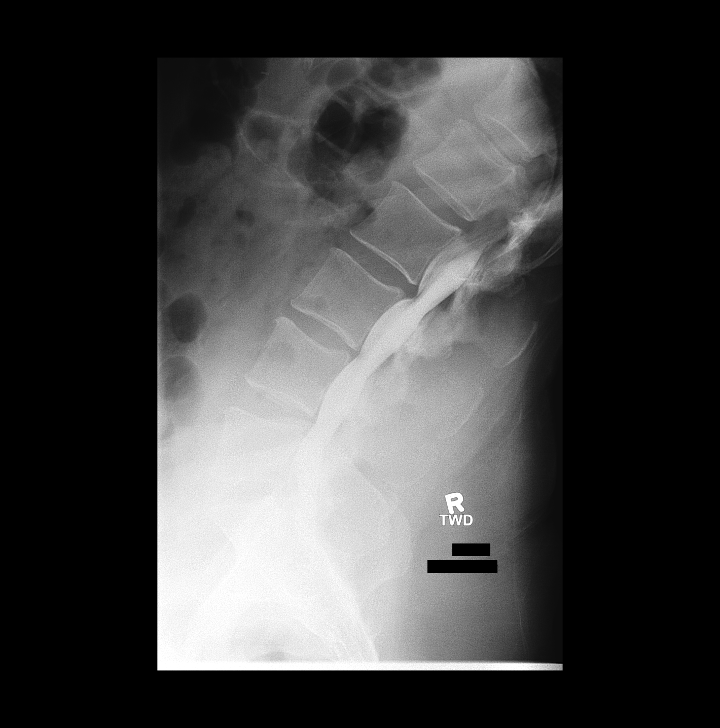

[7 of 7 positions shown; findings below may reference images not displayed]

FINDINGS: LUMBAR MYELOGRAM FINDINGS:

Intrathecal injection without abnormal filling defect. L2-3
extradural with filling defect consistent with known protrusion. No
subluxation with flexion or extension. No dynamic impingement.

CT LUMBAR MYELOGRAM FINDINGS:

Segmentation: Standard.

Alignment: Physiologic.

Vertebrae: No fracture, discitis, or bone lesion.

Conus: Extends to the L1 level and appears normal. Normal ventral
positioning of the conus and nerve roots in this the prone patient.
No nerve root clumping.

Paraspinal and retroperitoneal structures: Negative.

Disc levels:

T12- L1:

Negative disc.  Mild marginal spurring

L1-L2:

Minor disc bulging. Facet arthropathy with right more than left
marginal spurring.

L2-L3:

Chronic central disc protrusion without canal stenosis. Mild facet
arthropathy with marginal spurring. No impingement.

L3-L4:

Degenerative facet arthropathy with marginal spurring greater on the
right. Small left foraminal disc protrusion without impingement,
chronic. Presumed root sleeve cysts in the left foramen.

L4-L5:

Facet arthropathy with marginal spurring and bony/ligamentous
overgrowth. No impingement. Intrathecal contrast tracks along the
bilateral L4 nerves, further on the right and reaching the lower L5
body level. This was not seen on previous myelogram, but contrast
mixing is better today.

L5-S1:

No herniation or notable disc degeneration. Essentially negative
facets. Epidural lipomatosis. No impingement.
IMPRESSION: LUMBAR MYELOGRAM IMPRESSION:

No instability or dynamic impingement.

CT LUMBAR MYELOGRAM IMPRESSION:

1. Degenerative facet arthropathy and milder disc degeneration
without progression since 3303 comparison. L3-4 left foraminal
protrusion remains small and noncompressive. No clear explanation
for left leg symptoms.
2. Intrathecal contrast tracks further than usually seen along the
right L4 nerve root. Are there any signs of intracranial
hypotension?

## 2017-12-16 DIAGNOSIS — M5416 Radiculopathy, lumbar region: Secondary | ICD-10-CM | POA: Diagnosis not present

## 2017-12-16 DIAGNOSIS — G894 Chronic pain syndrome: Secondary | ICD-10-CM | POA: Diagnosis not present

## 2017-12-16 DIAGNOSIS — M792 Neuralgia and neuritis, unspecified: Secondary | ICD-10-CM | POA: Diagnosis not present

## 2017-12-29 DIAGNOSIS — M5116 Intervertebral disc disorders with radiculopathy, lumbar region: Secondary | ICD-10-CM | POA: Diagnosis not present

## 2018-01-02 DIAGNOSIS — M5116 Intervertebral disc disorders with radiculopathy, lumbar region: Secondary | ICD-10-CM | POA: Diagnosis not present

## 2018-01-09 ENCOUNTER — Ambulatory Visit (HOSPITAL_COMMUNITY): Payer: Self-pay | Admitting: Orthopedic Surgery

## 2018-01-16 ENCOUNTER — Ambulatory Visit (INDEPENDENT_AMBULATORY_CARE_PROVIDER_SITE_OTHER): Payer: BLUE CROSS/BLUE SHIELD | Admitting: Nurse Practitioner

## 2018-01-16 ENCOUNTER — Encounter: Payer: Self-pay | Admitting: Nurse Practitioner

## 2018-01-16 VITALS — BP 123/87 | HR 98 | Temp 98.4°F | Ht 70.0 in | Wt 206.8 lb

## 2018-01-16 DIAGNOSIS — M79641 Pain in right hand: Secondary | ICD-10-CM | POA: Diagnosis not present

## 2018-01-16 DIAGNOSIS — M79642 Pain in left hand: Secondary | ICD-10-CM

## 2018-01-16 MED ORDER — NAPROXEN 500 MG PO TABS: 500.0000 mg | ORAL_TABLET | Freq: Two times a day (BID) | ORAL | 1 refills | Status: DC

## 2018-01-16 NOTE — Progress Notes (Signed)
   Subjective:    Patient ID: Boykin Peek, female    DOB: 04/25/1970, 48 y.o.   MRN: [id number]   Chief Complaint: Hand Pain (bilateral.  Patient states she has been having sharpe pains in bilateral hands that go down wrist. x 3-4 weeks)   HPI Patient comes in today c/o hand pain bil. Pain runs down hand and around thumb. Left hand worse then right. 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 Both hands will fall asleep at night and it wakes her up.    Review of Systems  Constitutional: Negative.   Respiratory: Negative.   Cardiovascular: Negative.   Genitourinary: Negative.   Musculoskeletal:       Bil hand pain  Neurological: Negative.   Psychiatric/Behavioral: Negative.   All other systems reviewed and are negative.      Objective:   Physical Exam Constitutional:      Appearance: Normal appearance. She is normal weight.  Cardiovascular:     Rate and Rhythm: Normal rate and regular rhythm.     Heart sounds: Normal heart sounds.  Pulmonary:     Effort: Pulmonary effort is normal.     Breath sounds: Normal breath sounds.  Musculoskeletal: Normal range of motion.     Comments: FROM of bil wrist (+) phalen bil (-) tinel's Left grip weaker then right  Neurological:     Mental Status: She is alert.    BP 123/87   Pulse 98   Temp 98.4 F (36.9 C) (Oral)   Ht 5\' 10"  (1.778 m)   Wt 206 lb 12.8 oz (93.8 kg)   BMI 29.67 kg/m         Assessment & Plan:  Boykin Peek in today with chief complaint of Hand Pain (bilateral.  Patient states she has been having sharpe pains in bilateral hands that go down wrist. x 3-4 weeks)   1. Bilateral hand pain Will call with nerve conduction results Need a wrist rest to use when typing Wear wrist brace as much as possible. - Nerve conduction test; Future - naproxen (NAPROSYN) 500 MG tablet; Take 1 tablet (500 mg total) by mouth 2 (two) times daily with a meal.  Dispense: 60 tablet; Refill: 1  Mary-Margaret  Daphine Deutscher, FNP

## 2018-01-16 NOTE — Patient Instructions (Signed)
Carpal Tunnel Syndrome    Carpal tunnel syndrome is a condition that causes pain in your hand and arm. The carpal tunnel is a narrow area that is on the palm side of your wrist. Repeated wrist motion or certain diseases may cause swelling in the tunnel. This swelling can pinch the main nerve in the wrist (median nerve).  What are the causes?  This condition may be caused by:   Repeated wrist motions.   Wrist injuries.   Arthritis.   A sac of fluid (cyst) or abnormal growth (tumor) in the carpal tunnel.   Fluid buildup during pregnancy.  Sometimes the cause is not known.  What increases the risk?  The following factors may make you more likely to develop this condition:   Having a job in which you move your wrist in the same way many times. This includes jobs like being a butcher or a cashier.   Being a woman.   Having other health conditions, such as:  ? Diabetes.  ? Obesity.  ? A thyroid gland that is not active enough (hypothyroidism).  ? Kidney failure.  What are the signs or symptoms?  Symptoms of this condition include:   A tingling feeling in your fingers.   Tingling or a loss of feeling (numbness) in your hand.   Pain in your entire arm. This pain may get worse when you bend your wrist and elbow for a long time.   Pain in your wrist that goes up your arm to your shoulder.   Pain that goes down into your palm or fingers.   A weak feeling in your hands. You may find it hard to grab and hold items.  You may feel worse at night.  How is this diagnosed?  This condition is diagnosed with a medical history and physical exam. You may also have tests, such as:   Electromyogram (EMG). This test checks the signals that the nerves send to the muscles.   Nerve conduction study. This test checks how well signals pass through your nerves.   Imaging tests, such as X-rays, ultrasound, and MRI. These tests check for what might be the cause of your condition.  How is this treated?  This condition may be treated  with:   Lifestyle changes. You will be asked to stop or change the activity that caused your problem.   Doing exercise and activities that make bones and muscles stronger (physical therapy).   Learning how to use your hand again (occupational therapy).   Medicines for pain and swelling (inflammation). You may have injections in your wrist.   A wrist splint.   Surgery.  Follow these instructions at home:  If you have a splint:   Wear the splint as told by your doctor. Remove it only as told by your doctor.   Loosen the splint if your fingers:  ? Tingle.  ? Lose feeling (become numb).  ? Turn cold and blue.   Keep the splint clean.   If the splint is not waterproof:  ? Do not let it get wet.  ? Cover it with a watertight covering when you take a bath or a shower.  Managing pain, stiffness, and swelling     If told, put ice on the painful area:  ? If you have a removable splint, remove it as told by your doctor.  ? Put ice in a plastic bag.  ? Place a towel between your skin and the bag.  ? Leave the   ice on for 20 minutes, 2-3 times per day.  General instructions   Take over-the-counter and prescription medicines only as told by your doctor.   Rest your wrist from any activity that may cause pain. If needed, talk with your boss at work about changes that can help your wrist heal.   Do any exercises as told by your doctor, physical therapist, or occupational therapist.   Keep all follow-up visits as told by your doctor. This is important.  Contact a doctor if:   You have new symptoms.   Medicine does not help your pain.   Your symptoms get worse.  Get help right away if:   You have very bad numbness or tingling in your wrist or hand.  Summary   Carpal tunnel syndrome is a condition that causes pain in your hand and arm.   It is often caused by repeated wrist motions.   Lifestyle changes and medicines are used to treat this problem. Surgery may help in very bad cases.   Follow your doctor's  instructions about wearing a splint, resting your wrist, keeping follow-up visits, and calling for help.  This information is not intended to replace advice given to you by your health care provider. Make sure you discuss any questions you have with your health care provider.  Document Released: 12/10/2010 Document Revised: 04/29/2017 Document Reviewed: 04/29/2017  Elsevier Interactive Patient Education  2019 [employer]

## 2018-01-26 ENCOUNTER — Ambulatory Visit: Payer: BLUE CROSS/BLUE SHIELD | Admitting: Family Medicine

## 2018-01-27 ENCOUNTER — Ambulatory Visit (INDEPENDENT_AMBULATORY_CARE_PROVIDER_SITE_OTHER): Payer: BLUE CROSS/BLUE SHIELD | Admitting: Family Medicine

## 2018-01-27 ENCOUNTER — Encounter: Payer: Self-pay | Admitting: Family Medicine

## 2018-01-27 VITALS — BP 121/81 | HR 86 | Temp 97.2°F | Ht 70.0 in | Wt 207.8 lb

## 2018-01-27 DIAGNOSIS — Z01818 Encounter for other preprocedural examination: Secondary | ICD-10-CM | POA: Diagnosis not present

## 2018-01-27 DIAGNOSIS — R5383 Other fatigue: Secondary | ICD-10-CM

## 2018-01-27 NOTE — Progress Notes (Signed)
BP 121/81   Pulse 86   Temp (!) 97.2 F (36.2 C) (Oral)   Ht 5' 10"  (1.778 m)   Wt 94.3 kg   BMI 29.82 kg/m    Subjective:    Patient ID: Courtney Brooks, female    DOB: 07-07-1970, 48 y.o.   MRN: [id number]  HPI: Courtney Brooks is a 48 y.o. female presenting on 01/27/2018 for surgical clearance   HPI Patient is coming in for a preoperative clearance. She is scheduled for a lumbar spinal cord stimulator insertion with Dr. Rolena Infante on 02/09/2018.   Patient denies chest pain or shortness of breath. She has never had kidney disease, arrhythmias, stroke, HF, MI, seizures, thyroid disease, liver disease, asthma, diabetes or bronchitis. She has no family history of reaction to anesthesia, and she has been put to sleep before without any problems.  She states she has had bloodwork for her thyroid come back as borderline in the past. She has felt fatigued recently and would like to evaluate the thyroid in addition to the other labwork done today.  The only medication she regularly takes is naproxen for wrist pain which she attributes to possible carpal tunnel.   Relevant past medical, surgical, family and social history reviewed and updated as indicated. Interim medical history since our last visit reviewed. Allergies and medications reviewed and updated.  Review of Systems  Constitutional: Positive for fatigue. Negative for chills and fever.  HENT: Negative.   Respiratory: Negative for cough and shortness of breath.   Cardiovascular: Negative for chest pain, palpitations and leg swelling.       Objective:    BP 121/81   Pulse 86   Temp (!) 97.2 F (36.2 C) (Oral)   Ht 5' 10"  (1.778 m)   Wt 94.3 kg   BMI 29.82 kg/m   Wt Readings from Last 3 Encounters:  01/27/18 94.3 kg  01/16/18 93.8 kg  10/18/17 93.9 kg    Physical Exam Constitutional:      General: She is not in acute distress.    Appearance: Normal appearance.  HENT:     Mouth/Throat:     Mouth: Mucous membranes are  moist.     Pharynx: Oropharynx is clear.     Comments: Mallampati score: class II Neck:     Musculoskeletal: Neck supple. No neck rigidity.     Thyroid: No thyromegaly.  Cardiovascular:     Rate and Rhythm: Normal rate and regular rhythm.     Heart sounds: Normal heart sounds.  Pulmonary:     Breath sounds: Normal breath sounds.  Abdominal:     Palpations: Abdomen is soft. There is no mass.     Tenderness: There is no abdominal tenderness.  Musculoskeletal:     Right lower leg: No edema.     Left lower leg: No edema.  Lymphadenopathy:     Cervical: No cervical adenopathy.    EKG: normal sinus rhythm, normal sinus rhythm without any abnormalities    Assessment & Plan:   Problem List Items Addressed This Visit    None    Visit Diagnoses    Pre-operative clearance    -  Primary   Relevant Orders   EKG 12-Lead (Completed)   CBC with Differential/Platelet   CMP14+EGFR   Lipid panel   TSH   Other fatigue            Follow up plan: Return in about 1 year (around 01/28/2019), or if symptoms worsen or fail to  improve.  Counseling provided for all of the vaccine components Orders Placed This Encounter  Procedures  . CBC with Differential/Platelet  . CMP14+EGFR  . Lipid panel  . TSH  . EKG 12-Lead    [place], [facility] 01/27/2018, 2:47 PM  Patient seen and examined with Adron Bene, PA student, will evaluate for possible symptoms of fatigue with blood work, also discussed possible sleep apnea with the patient and she will consider that testing in the future.  It does seem like she is not sleeping as well at night as she should. Caryl Pina, MD [facility] 01/31/2018, 10:11 PM

## 2018-01-28 LAB — CBC WITH DIFFERENTIAL/PLATELET
BASOS: 1 %
Basophils Absolute: 0 10*3/uL (ref 0.0–0.2)
EOS (ABSOLUTE): 0.2 10*3/uL (ref 0.0–0.4)
EOS: 3 %
HEMATOCRIT: 39.8 % (ref 34.0–46.6)
HEMOGLOBIN: 13.2 g/dL (ref 11.1–15.9)
IMMATURE GRANS (ABS): 0 10*3/uL (ref 0.0–0.1)
IMMATURE GRANULOCYTES: 0 %
LYMPHS: 37 %
Lymphocytes Absolute: 2.6 10*3/uL (ref 0.7–3.1)
MCH: 30.4 pg (ref 26.6–33.0)
MCHC: 33.2 g/dL (ref 31.5–35.7)
MCV: 92 fL (ref 79–97)
Monocytes Absolute: 0.5 10*3/uL (ref 0.1–0.9)
Monocytes: 7 %
NEUTROS ABS: 3.6 10*3/uL (ref 1.4–7.0)
NEUTROS PCT: 52 %
PLATELETS: 321 10*3/uL (ref 150–450)
RBC: 4.34 x10E6/uL (ref 3.77–5.28)
RDW: 12.8 % (ref 11.7–15.4)
WBC: 7 10*3/uL (ref 3.4–10.8)

## 2018-01-28 LAB — CMP14+EGFR
A/G RATIO: 1.6 (ref 1.2–2.2)
ALBUMIN: 4.4 g/dL (ref 3.8–4.8)
ALT: 40 IU/L — AB (ref 0–32)
AST: 20 IU/L (ref 0–40)
Alkaline Phosphatase: 69 IU/L (ref 39–117)
BUN / CREAT RATIO: 23 (ref 9–23)
BUN: 21 mg/dL (ref 6–24)
Bilirubin Total: 0.3 mg/dL (ref 0.0–1.2)
CALCIUM: 10 mg/dL (ref 8.7–10.2)
CO2: 21 mmol/L (ref 20–29)
Chloride: 100 mmol/L (ref 96–106)
Creatinine, Ser: 0.92 mg/dL (ref 0.57–1.00)
GFR, EST AFRICAN AMERICAN: 85 mL/min/{1.73_m2} (ref >59)
GFR, EST NON AFRICAN AMERICAN: 73 mL/min/{1.73_m2} (ref >59)
GLOBULIN, TOTAL: 2.7 g/dL (ref 1.5–4.5)
Glucose: 91 mg/dL (ref 65–99)
POTASSIUM: 4.1 mmol/L (ref 3.5–5.2)
Sodium: 139 mmol/L (ref 134–144)
TOTAL PROTEIN: 7.1 g/dL (ref 6.0–8.5)

## 2018-01-28 LAB — LIPID PANEL
CHOL/HDL RATIO: 2.6 ratio (ref 0.0–4.4)
Cholesterol, Total: 172 mg/dL (ref 100–199)
HDL: 66 mg/dL (ref >39)
LDL Calculated: 86 mg/dL (ref 0–99)
Triglycerides: 101 mg/dL (ref 0–149)
VLDL Cholesterol Cal: 20 mg/dL (ref 5–40)

## 2018-01-28 LAB — TSH: TSH: 1.72 u[IU]/mL (ref 0.450–4.500)

## 2018-01-30 NOTE — Pre-Procedure Instructions (Signed)
Courtney Brooks  01/30/2018      [facility] - [facility], [place] - [address] [place] [address] Phone: [phone] Fax: [phone]    Your procedure is scheduled on Thursday, February 6th.  Report to [facility] Admitting at 5:30 A.M.  Call this number if you have problems the morning of surgery:  [phone]   Remember:  Do not eat or drink after midnight.    Take these medicines the morning of surgery with A SIP OF WATER  fluticasone (FLONASE)  7 days prior to surgery STOP taking any Aspirin (unless otherwise instructed by your surgeon), Aleve, Naproxen, Ibuprofen, Motrin, Advil, Goody's, BC's, all herbal medications, fish oil, and all vitamins.     Do not wear jewelry, make-up or nail polish.  Do not wear lotions, powders, or perfumes, or deodorant.  Do not shave 48 hours prior to surgery.    Do not bring valuables to the hospital.  [facility] is not responsible for any belongings or valuables.  Contacts, dentures or bridgework may not be worn into surgery.  Leave your suitcase in the car.  After surgery it may be brought to your room.  For patients admitted to the hospital, discharge time will be determined by your treatment team.  Patients discharged the day of surgery will not be allowed to drive home.   Special instructions:   [facility]- Preparing For Surgery  Before surgery, you can play an important role. Because skin is not sterile, your skin needs to be as free of germs as possible. You can reduce the number of germs on your skin by washing with CHG (chlorahexidine gluconate) Soap before surgery.  CHG is an antiseptic cleaner which kills germs and bonds with the skin to continue killing germs even after washing.    Oral Hygiene is also important to reduce your risk of infection.  Remember - BRUSH YOUR TEETH THE MORNING OF SURGERY WITH YOUR REGULAR TOOTHPASTE  Please do not use if you have an allergy to CHG or  antibacterial soaps. If your skin becomes reddened/irritated stop using the CHG.  Do not shave (including legs and underarms) for at least 48 hours prior to first CHG shower. It is OK to shave your face.  Please follow these instructions carefully.   1. Shower the NIGHT BEFORE SURGERY and the MORNING OF SURGERY with CHG.   2. If you chose to wash your hair, wash your hair first as usual with your normal shampoo.  3. After you shampoo, rinse your hair and body thoroughly to remove the shampoo.  4. Use CHG as you would any other liquid soap. You can apply CHG directly to the skin and wash gently with a scrungie or a clean washcloth.   5. Apply the CHG Soap to your body ONLY FROM THE NECK DOWN.  Do not use on open wounds or open sores. Avoid contact with your eyes, ears, mouth and genitals (private parts). Wash Face and genitals (private parts)  with your normal soap.  6. Wash thoroughly, paying special attention to the area where your surgery will be performed.  7. Thoroughly rinse your body with warm water from the neck down.  8. DO NOT shower/wash with your normal soap after using and rinsing off the CHG Soap.  9. Pat yourself dry with a CLEAN TOWEL.  10. Wear CLEAN PAJAMAS to bed the night before surgery, wear comfortable clothes the morning of surgery  11. Place CLEAN SHEETS on your bed the night of your first shower and DO NOT SLEEP WITH PETS.    Day of Surgery:  Do not apply any deodorants/lotions.  Please wear clean clothes to the hospital/surgery center.   Remember to brush your teeth WITH YOUR REGULAR TOOTHPASTE.   Please read over the following fact sheets that you were given.

## 2018-01-31 ENCOUNTER — Encounter (HOSPITAL_COMMUNITY): Payer: Self-pay

## 2018-01-31 ENCOUNTER — Encounter (HOSPITAL_COMMUNITY)
Admission: RE | Admit: 2018-01-31 | Discharge: 2018-01-31 | Disposition: A | Discharge status: Home / Self Care | Payer: BLUE CROSS/BLUE SHIELD | Source: Ambulatory Visit | Attending: Orthopedic Surgery | Admitting: Orthopedic Surgery

## 2018-01-31 ENCOUNTER — Other Ambulatory Visit: Payer: Self-pay

## 2018-01-31 DIAGNOSIS — Z01812 Encounter for preprocedural laboratory examination: Secondary | ICD-10-CM | POA: Diagnosis not present

## 2018-01-31 LAB — SURGICAL PCR SCREEN
MRSA, PCR: NEGATIVE
STAPHYLOCOCCUS AUREUS: NEGATIVE

## 2018-01-31 NOTE — Progress Notes (Signed)
PCP -Dr. Ivin Booty Dettinger  Cardiologist - denies  Chest x-ray - N/A EKG - 01/27/18 Stress Test - denies ECHO - denies Cardiac Cath - denies  Sleep Study - denies  Blood Thinner Instructions: N/A Aspirin Instructions:N/A  Recent labs drawn on 01/27/17. No labs collected at PAT.  Anesthesia review: No  Patient denies shortness of breath, fever, cough and chest pain at PAT appointment   Patient verbalized understanding of instructions that were given to them at the PAT appointment. Patient was also instructed that they will need to review over the PAT instructions again at home before surgery.

## 2018-02-08 NOTE — Anesthesia Preprocedure Evaluation (Addendum)
Anesthesia Evaluation  Patient identified by MRN, date of birth, ID band Patient awake    Reviewed: Allergy & Precautions, NPO status , Patient's Chart, lab work & pertinent test results  History of Anesthesia Complications (+) Emergence Delirium  Airway Mallampati: II  TM Distance: >3 FB     Dental   Pulmonary neg pulmonary ROS,    breath sounds clear to auscultation       Cardiovascular negative cardio ROS   Rhythm:Regular Rate:Normal     Neuro/Psych    GI/Hepatic Neg liver ROS, GERD  ,  Endo/Other  negative endocrine ROS  Renal/GU negative Renal ROS     Musculoskeletal   Abdominal   Peds  Hematology   Anesthesia Other Findings   Reproductive/Obstetrics                            Anesthesia Physical Anesthesia Plan  ASA: III  Anesthesia Plan: General   Post-op Pain Management:    Induction: Intravenous  PONV Risk Score and Plan: Ondansetron, Dexamethasone and Midazolam  Airway Management Planned: Oral ETT  Additional Equipment:   Intra-op Plan:   Post-operative Plan: Extubation in OR  Informed Consent: I have reviewed the patients History and Physical, chart, labs and discussed the procedure including the risks, benefits and alternatives for the proposed anesthesia with the patient or authorized representative who has indicated his/her understanding and acceptance.     Dental advisory given  Plan Discussed with: Anesthesiologist and CRNA  Anesthesia Plan Comments:       Anesthesia Quick Evaluation

## 2018-02-09 ENCOUNTER — Encounter (HOSPITAL_COMMUNITY): Payer: Self-pay | Admitting: *Deleted

## 2018-02-09 ENCOUNTER — Ambulatory Visit (HOSPITAL_COMMUNITY): Payer: BLUE CROSS/BLUE SHIELD

## 2018-02-09 ENCOUNTER — Other Ambulatory Visit: Payer: Self-pay

## 2018-02-09 ENCOUNTER — Ambulatory Visit (HOSPITAL_COMMUNITY): Payer: BLUE CROSS/BLUE SHIELD | Admitting: Anesthesiology

## 2018-02-09 ENCOUNTER — Observation Stay (HOSPITAL_COMMUNITY)
Admission: RE | Admit: 2018-02-09 | Discharge: 2018-02-10 | Disposition: A | Discharge status: Home / Self Care | Payer: BLUE CROSS/BLUE SHIELD | Attending: Orthopedic Surgery | Admitting: Orthopedic Surgery

## 2018-02-09 ENCOUNTER — Encounter (HOSPITAL_COMMUNITY)
Admission: RE | Disposition: A | Discharge status: Home / Self Care | Payer: Self-pay | Source: Home / Self Care | Attending: Orthopedic Surgery

## 2018-02-09 DIAGNOSIS — Y831 Surgical operation with implant of artificial internal device as the cause of abnormal reaction of the patient, or of later complication, without mention of misadventure at the time of the procedure: Secondary | ICD-10-CM | POA: Insufficient documentation

## 2018-02-09 DIAGNOSIS — Z791 Long term (current) use of non-steroidal anti-inflammatories (NSAID): Secondary | ICD-10-CM | POA: Diagnosis not present

## 2018-02-09 DIAGNOSIS — G8929 Other chronic pain: Secondary | ICD-10-CM | POA: Diagnosis present

## 2018-02-09 DIAGNOSIS — G894 Chronic pain syndrome: Secondary | ICD-10-CM | POA: Diagnosis not present

## 2018-02-09 DIAGNOSIS — Z419 Encounter for procedure for purposes other than remedying health state, unspecified: Secondary | ICD-10-CM

## 2018-02-09 DIAGNOSIS — Z7951 Long term (current) use of inhaled steroids: Secondary | ICD-10-CM | POA: Diagnosis not present

## 2018-02-09 DIAGNOSIS — Z79899 Other long term (current) drug therapy: Secondary | ICD-10-CM | POA: Insufficient documentation

## 2018-02-09 DIAGNOSIS — Z969 Presence of functional implant, unspecified: Secondary | ICD-10-CM | POA: Diagnosis not present

## 2018-02-09 DIAGNOSIS — M961 Postlaminectomy syndrome, not elsewhere classified: Principal | ICD-10-CM | POA: Insufficient documentation

## 2018-02-09 HISTORY — PX: SPINAL CORD STIMULATOR INSERTION: SHX5378

## 2018-02-09 SURGERY — INSERTION, SPINAL CORD STIMULATOR, LUMBAR
Anesthesia: General | Site: Back

## 2018-02-09 MED ORDER — CEFAZOLIN SODIUM-DEXTROSE 1-4 GM/50ML-% IV SOLN
1.0000 g | Freq: Three times a day (TID) | INTRAVENOUS | Status: AC
  Administered 2018-02-09 (×2): 1 g via INTRAVENOUS
  Filled 2018-02-09 (×2): qty 50

## 2018-02-09 MED ORDER — ONDANSETRON HCL 4 MG/2ML IJ SOLN
INTRAMUSCULAR | Status: AC
  Filled 2018-02-09: qty 2

## 2018-02-09 MED ORDER — MORPHINE SULFATE (PF) 2 MG/ML IV SOLN
2.0000 mg | INTRAVENOUS | Status: DC | PRN
  Administered 2018-02-09: 2 mg via INTRAVENOUS
  Filled 2018-02-09: qty 1

## 2018-02-09 MED ORDER — DIPHENHYDRAMINE HCL 25 MG PO CAPS
25.0000 mg | ORAL_CAPSULE | Freq: Four times a day (QID) | ORAL | Status: DC | PRN
  Administered 2018-02-09 – 2018-02-10 (×4): 25 mg via ORAL
  Filled 2018-02-09 (×4): qty 1

## 2018-02-09 MED ORDER — ONDANSETRON HCL 4 MG PO TABS: 4.0000 mg | ORAL_TABLET | Freq: Four times a day (QID) | ORAL | Status: DC | PRN

## 2018-02-09 MED ORDER — MIDAZOLAM HCL 5 MG/5ML IJ SOLN
INTRAMUSCULAR | Status: DC | PRN
  Administered 2018-02-09: 2 mg via INTRAVENOUS

## 2018-02-09 MED ORDER — ACETAMINOPHEN 325 MG PO TABS: 650.0000 mg | ORAL_TABLET | ORAL | Status: DC | PRN

## 2018-02-09 MED ORDER — ACETAMINOPHEN 650 MG RE SUPP: 650.0000 mg | RECTAL | Status: DC | PRN

## 2018-02-09 MED ORDER — SUGAMMADEX SODIUM 200 MG/2ML IV SOLN
INTRAVENOUS | Status: DC | PRN
  Administered 2018-02-09 (×2): 100 mg via INTRAVENOUS

## 2018-02-09 MED ORDER — ROCURONIUM BROMIDE 50 MG/5ML IV SOSY
PREFILLED_SYRINGE | INTRAVENOUS | Status: DC | PRN
  Administered 2018-02-09: 20 mg via INTRAVENOUS
  Administered 2018-02-09: 30 mg via INTRAVENOUS
  Administered 2018-02-09: 50 mg via INTRAVENOUS

## 2018-02-09 MED ORDER — DEXAMETHASONE SODIUM PHOSPHATE 10 MG/ML IJ SOLN
INTRAMUSCULAR | Status: AC
  Filled 2018-02-09: qty 1

## 2018-02-09 MED ORDER — METHOCARBAMOL 1000 MG/10ML IJ SOLN
500.0000 mg | Freq: Four times a day (QID) | INTRAVENOUS | Status: DC | PRN
  Filled 2018-02-09: qty 5

## 2018-02-09 MED ORDER — OXYCODONE HCL 5 MG PO TABS
10.0000 mg | ORAL_TABLET | ORAL | Status: DC | PRN
  Administered 2018-02-09 – 2018-02-10 (×5): 10 mg via ORAL
  Filled 2018-02-09 (×4): qty 2

## 2018-02-09 MED ORDER — PROPOFOL 10 MG/ML IV BOLUS
INTRAVENOUS | Status: AC
  Filled 2018-02-09: qty 20

## 2018-02-09 MED ORDER — OXYCODONE HCL 5 MG PO TABS
ORAL_TABLET | ORAL | Status: AC
  Filled 2018-02-09: qty 2

## 2018-02-09 MED ORDER — BUPIVACAINE-EPINEPHRINE 0.5% -1:200000 IJ SOLN
INTRAMUSCULAR | Status: AC
  Filled 2018-02-09: qty 1

## 2018-02-09 MED ORDER — CEFAZOLIN SODIUM-DEXTROSE 2-4 GM/100ML-% IV SOLN
2.0000 g | INTRAVENOUS | Status: AC
  Administered 2018-02-09: 2 g via INTRAVENOUS
  Filled 2018-02-09: qty 100

## 2018-02-09 MED ORDER — 0.9 % SODIUM CHLORIDE (POUR BTL) OPTIME
TOPICAL | Status: DC | PRN
  Administered 2018-02-09: 1000 mL

## 2018-02-09 MED ORDER — FENTANYL CITRATE (PF) 100 MCG/2ML IJ SOLN
INTRAMUSCULAR | Status: DC | PRN
  Administered 2018-02-09: 100 ug via INTRAVENOUS
  Administered 2018-02-09: 50 ug via INTRAVENOUS

## 2018-02-09 MED ORDER — ROCURONIUM BROMIDE 50 MG/5ML IV SOSY
PREFILLED_SYRINGE | INTRAVENOUS | Status: AC
  Filled 2018-02-09: qty 10

## 2018-02-09 MED ORDER — POLYETHYLENE GLYCOL 3350 17 G PO PACK: 17.0000 g | PACK | Freq: Every day | ORAL | Status: DC | PRN

## 2018-02-09 MED ORDER — TRANEXAMIC ACID-NACL 1000-0.7 MG/100ML-% IV SOLN
INTRAVENOUS | Status: AC
  Filled 2018-02-09: qty 100

## 2018-02-09 MED ORDER — HEMOSTATIC AGENTS (NO CHARGE) OPTIME
TOPICAL | Status: DC | PRN
  Administered 2018-02-09: 1 via TOPICAL

## 2018-02-09 MED ORDER — LACTATED RINGERS IV SOLN
INTRAVENOUS | Status: DC | PRN
  Administered 2018-02-09: 07:00:00 via INTRAVENOUS

## 2018-02-09 MED ORDER — THROMBIN 20000 UNITS EX SOLR
CUTANEOUS | Status: DC | PRN
  Administered 2018-02-09: 09:00:00 via TOPICAL

## 2018-02-09 MED ORDER — LACTATED RINGERS IV SOLN
INTRAVENOUS | Status: DC
  Administered 2018-02-09: 500 mL via INTRAVENOUS

## 2018-02-09 MED ORDER — PROPOFOL 10 MG/ML IV BOLUS
INTRAVENOUS | Status: DC | PRN
  Administered 2018-02-09: 150 mg via INTRAVENOUS

## 2018-02-09 MED ORDER — FLUTICASONE PROPIONATE 50 MCG/ACT NA SUSP: 2.0000 | Freq: Every day | NASAL | Status: DC | PRN

## 2018-02-09 MED ORDER — PHENOL 1.4 % MT LIQD: 1.0000 | OROMUCOSAL | Status: DC | PRN

## 2018-02-09 MED ORDER — BUPIVACAINE HCL (PF) 0.25 % IJ SOLN
INTRAMUSCULAR | Status: AC
  Filled 2018-02-09: qty 30

## 2018-02-09 MED ORDER — FENTANYL CITRATE (PF) 100 MCG/2ML IJ SOLN
INTRAMUSCULAR | Status: AC
  Filled 2018-02-09: qty 2

## 2018-02-09 MED ORDER — DEXAMETHASONE 4 MG PO TABS
4.0000 mg | ORAL_TABLET | Freq: Four times a day (QID) | ORAL | Status: AC
  Administered 2018-02-09 – 2018-02-10 (×2): 4 mg via ORAL
  Filled 2018-02-09 (×2): qty 1

## 2018-02-09 MED ORDER — FENTANYL CITRATE (PF) 100 MCG/2ML IJ SOLN
25.0000 ug | INTRAMUSCULAR | Status: DC | PRN
  Administered 2018-02-09 (×3): 50 ug via INTRAVENOUS

## 2018-02-09 MED ORDER — METHOCARBAMOL 500 MG PO TABS
500.0000 mg | ORAL_TABLET | Freq: Four times a day (QID) | ORAL | Status: DC | PRN
  Administered 2018-02-09 – 2018-02-10 (×4): 500 mg via ORAL
  Filled 2018-02-09 (×3): qty 1

## 2018-02-09 MED ORDER — SODIUM CHLORIDE 0.9 % IV SOLN: 250.0000 mL | INTRAVENOUS | Status: DC

## 2018-02-09 MED ORDER — TRANEXAMIC ACID-NACL 1000-0.7 MG/100ML-% IV SOLN
1000.0000 mg | INTRAVENOUS | Status: AC
  Administered 2018-02-09: 1000 mg via INTRAVENOUS

## 2018-02-09 MED ORDER — ONDANSETRON HCL 4 MG/2ML IJ SOLN
INTRAMUSCULAR | Status: DC | PRN
  Administered 2018-02-09: 4 mg via INTRAVENOUS

## 2018-02-09 MED ORDER — SODIUM CHLORIDE 0.9% FLUSH: 3.0000 mL | INTRAVENOUS | Status: DC | PRN

## 2018-02-09 MED ORDER — ACETAMINOPHEN 10 MG/ML IV SOLN
INTRAVENOUS | Status: DC | PRN
  Administered 2018-02-09: 1000 mg via INTRAVENOUS

## 2018-02-09 MED ORDER — SODIUM CHLORIDE 0.9% FLUSH: 3.0000 mL | Freq: Two times a day (BID) | INTRAVENOUS | Status: DC

## 2018-02-09 MED ORDER — LIDOCAINE 2% (20 MG/ML) 5 ML SYRINGE
INTRAMUSCULAR | Status: AC
  Filled 2018-02-09: qty 5

## 2018-02-09 MED ORDER — DEXAMETHASONE SODIUM PHOSPHATE 4 MG/ML IJ SOLN
4.0000 mg | Freq: Four times a day (QID) | INTRAMUSCULAR | Status: AC
  Administered 2018-02-09 (×2): 4 mg via INTRAVENOUS
  Filled 2018-02-09 (×2): qty 1

## 2018-02-09 MED ORDER — FENTANYL CITRATE (PF) 250 MCG/5ML IJ SOLN
INTRAMUSCULAR | Status: AC
  Filled 2018-02-09: qty 5

## 2018-02-09 MED ORDER — ONDANSETRON HCL 4 MG/2ML IJ SOLN: 4.0000 mg | Freq: Four times a day (QID) | INTRAMUSCULAR | Status: DC | PRN

## 2018-02-09 MED ORDER — METHOCARBAMOL 500 MG PO TABS: 500.0000 mg | ORAL_TABLET | Freq: Three times a day (TID) | ORAL | 0 refills | Status: AC | PRN

## 2018-02-09 MED ORDER — MENTHOL 3 MG MT LOZG: 1.0000 | LOZENGE | OROMUCOSAL | Status: DC | PRN

## 2018-02-09 MED ORDER — OXYCODONE HCL 5 MG PO TABS: 5.0000 mg | ORAL_TABLET | ORAL | Status: DC | PRN

## 2018-02-09 MED ORDER — LIDOCAINE 2% (20 MG/ML) 5 ML SYRINGE
INTRAMUSCULAR | Status: DC | PRN
  Administered 2018-02-09: 100 mg via INTRAVENOUS

## 2018-02-09 MED ORDER — METHOCARBAMOL 500 MG PO TABS
ORAL_TABLET | ORAL | Status: AC
  Filled 2018-02-09: qty 1

## 2018-02-09 MED ORDER — OXYCODONE-ACETAMINOPHEN 10-325 MG PO TABS: 1.0000 | ORAL_TABLET | Freq: Four times a day (QID) | ORAL | 0 refills | Status: AC | PRN

## 2018-02-09 MED ORDER — BUPIVACAINE-EPINEPHRINE 0.5% -1:200000 IJ SOLN
INTRAMUSCULAR | Status: DC | PRN
  Administered 2018-02-09: 20 mL

## 2018-02-09 MED ORDER — ONDANSETRON HCL 4 MG PO TABS: 4.0000 mg | ORAL_TABLET | Freq: Three times a day (TID) | ORAL | 0 refills | Status: DC | PRN

## 2018-02-09 MED ORDER — THROMBIN 20000 UNITS EX SOLR
CUTANEOUS | Status: AC
  Filled 2018-02-09: qty 20000

## 2018-02-09 MED ORDER — DEXAMETHASONE SODIUM PHOSPHATE 10 MG/ML IJ SOLN
INTRAMUSCULAR | Status: DC | PRN
  Administered 2018-02-09: 10 mg via INTRAVENOUS

## 2018-02-09 MED ORDER — MIDAZOLAM HCL 2 MG/2ML IJ SOLN
INTRAMUSCULAR | Status: AC
  Filled 2018-02-09: qty 2

## 2018-02-09 MED ORDER — ACETAMINOPHEN 10 MG/ML IV SOLN
INTRAVENOUS | Status: AC
  Filled 2018-02-09: qty 100

## 2018-02-09 SURGICAL SUPPLY — 62 items
CANISTER SUCT 3000ML PPV (MISCELLANEOUS) ×2 IMPLANT
CLSR STERI-STRIP ANTIMIC 1/2X4 (GAUZE/BANDAGES/DRESSINGS) ×2 IMPLANT
COVER MAYO STAND STRL (DRAPES) ×2 IMPLANT
COVER PROBE W GEL 5X96 (DRAPES) IMPLANT
COVER SURGICAL LIGHT HANDLE (MISCELLANEOUS) ×1 IMPLANT
COVER WAND RF STERILE (DRAPES) ×1 IMPLANT
DRAPE C-ARM 42X72 X-RAY (DRAPES) ×2 IMPLANT
DRAPE INCISE IOBAN 85X60 (DRAPES) ×1 IMPLANT
DRAPE SURG 17X23 STRL (DRAPES) ×2 IMPLANT
DRAPE U-SHAPE 47X51 STRL (DRAPES) ×2 IMPLANT
DRSG OPSITE POSTOP 4X6 (GAUZE/BANDAGES/DRESSINGS) ×3 IMPLANT
DURAPREP 26ML APPLICATOR (WOUND CARE) ×2 IMPLANT
ELECT BLADE 4.0 EZ CLEAN MEGAD (MISCELLANEOUS) ×2
ELECT CAUTERY BLADE 6.4 (BLADE) ×2 IMPLANT
ELECT PENCIL ROCKER SW 15FT (MISCELLANEOUS) ×2 IMPLANT
ELECT REM PT RETURN 9FT ADLT (ELECTROSURGICAL) ×2
ELECTRODE BLDE 4.0 EZ CLN MEGD (MISCELLANEOUS) IMPLANT
ELECTRODE REM PT RTRN 9FT ADLT (ELECTROSURGICAL) ×1 IMPLANT
GENERATOR PULSE PROCLAIM 5ELIT (Neuro Prosthesis/Implant) IMPLANT
GLOVE BIO SURGEON STRL SZ 6.5 (GLOVE) ×3 IMPLANT
GLOVE BIO SURGEON STRL SZ7 (GLOVE) ×2 IMPLANT
GLOVE BIOGEL PI IND STRL 6.5 (GLOVE) IMPLANT
GLOVE BIOGEL PI IND STRL 7.0 (GLOVE) ×1 IMPLANT
GLOVE BIOGEL PI IND STRL 8.5 (GLOVE) ×1 IMPLANT
GLOVE BIOGEL PI INDICATOR 6.5 (GLOVE) ×2
GLOVE BIOGEL PI INDICATOR 7.0 (GLOVE) ×1
GLOVE BIOGEL PI INDICATOR 8.5 (GLOVE) ×1
GLOVE SS N UNI LF 8.5 STRL (GLOVE) ×4 IMPLANT
GOWN STRL REUS W/ TWL LRG LVL3 (GOWN DISPOSABLE) ×2 IMPLANT
GOWN STRL REUS W/TWL 2XL LVL3 (GOWN DISPOSABLE) ×2 IMPLANT
GOWN STRL REUS W/TWL LRG LVL3 (GOWN DISPOSABLE) ×6
KIT BASIN OR (CUSTOM PROCEDURE TRAY) ×2 IMPLANT
KIT TURNOVER KIT B (KITS) ×2 IMPLANT
LAMI NARROW PRIPOLE 16CH (Orthopedic Implant) ×1 IMPLANT
NDL SPNL 18GX3.5 QUINCKE PK (NEEDLE) ×1 IMPLANT
NEEDLE 22X1 1/2 (OR ONLY) (NEEDLE) ×2 IMPLANT
NEEDLE SPNL 18GX3.5 QUINCKE PK (NEEDLE) ×2 IMPLANT
NS IRRIG 1000ML POUR BTL (IV SOLUTION) ×2 IMPLANT
PACK LAMINECTOMY ORTHO (CUSTOM PROCEDURE TRAY) ×2 IMPLANT
PACK UNIVERSAL I (CUSTOM PROCEDURE TRAY) ×2 IMPLANT
PAD ARMBOARD 7.5X6 YLW CONV (MISCELLANEOUS) ×6 IMPLANT
PROGRAMMER DBS W/MAGNET NMRI (MISCELLANEOUS) ×1 IMPLANT
PULSE GENERATOR PROCLAIM 5ELIT (Neuro Prosthesis/Implant) ×2 IMPLANT
SPATULA SILICONE BRAIN 10MM (MISCELLANEOUS) IMPLANT
SPONGE LAP 4X18 RFD (DISPOSABLE) IMPLANT
SPONGE SURGIFOAM ABS GEL 100 (HEMOSTASIS) ×2 IMPLANT
STAPLER VISISTAT 35W (STAPLE) ×1 IMPLANT
STRIP CLOSURE SKIN 1/2X4 (GAUZE/BANDAGES/DRESSINGS) ×1 IMPLANT
SURGIFLO W/THROMBIN 8M KIT (HEMOSTASIS) ×1 IMPLANT
SUT BONE WAX W31G (SUTURE) ×2 IMPLANT
SUT ETHIBOND 2 OS 4 DA (SUTURE) ×2 IMPLANT
SUT MNCRL AB 3-0 PS2 18 (SUTURE) ×4 IMPLANT
SUT VIC AB 1 CT1 18XCR BRD 8 (SUTURE) ×2 IMPLANT
SUT VIC AB 1 CT1 8-18 (SUTURE) ×4
SUT VIC AB 2-0 CT1 18 (SUTURE) ×2 IMPLANT
SYR BULB IRRIGATION 50ML (SYRINGE) ×2 IMPLANT
SYR CONTROL 10ML LL (SYRINGE) ×2 IMPLANT
TOOL TUNNELING 20 INCH (ORTHOPEDIC DISPOSABLE SUPPLIES) ×1 IMPLANT
TOWEL OR 17X24 6PK STRL BLUE (TOWEL DISPOSABLE) ×2 IMPLANT
TOWEL OR 17X26 10 PK STRL BLUE (TOWEL DISPOSABLE) ×2 IMPLANT
WATER STERILE IRR 1000ML POUR (IV SOLUTION) ×2 IMPLANT
YANKAUER SUCT BULB TIP NO VENT (SUCTIONS) ×2 IMPLANT

## 2018-02-09 NOTE — Evaluation (Signed)
Physical Therapy Evaluation Patient Details Name: Courtney Brooks MRN: [id number] DOB: 12-27-1970 Today's Date: 02/09/2018   History of Present Illness  Pt is a 48 y/o female s/p lumbar spinal cord stimulator. PMH includes lumbar surgery.   Clinical Impression  Patient is s/p above surgery resulting in the deficits listed below (see PT Problem List). Pt requiring min guard A for mobility this session. Pt very shaky and with increased nausea during gait and required seated rest. RN notified. Anticipate pt will progress well once pain controlled. Reviewed back precautions and walking program.  Patient will benefit from skilled PT to increase their independence and safety with mobility (while adhering to their precautions) to allow discharge to the venue listed below.     Follow Up Recommendations No PT follow up;Supervision for mobility/OOB    Equipment Recommendations  Rolling walker with 5" wheels;3in1 (PT)    Recommendations for Other Services       Precautions / Restrictions Precautions Precautions: Back Precaution Booklet Issued: Yes (comment) Precaution Comments: Reviewed back precautions with pt.  Restrictions Weight Bearing Restrictions: No      Mobility  Bed Mobility Overal bed mobility: Needs Assistance Bed Mobility: Rolling;Sidelying to Sit;Sit to Sidelying Rolling: Supervision Sidelying to sit: Supervision     Sit to sidelying: Supervision General bed mobility comments: Supervision for safety. Increase time required to perform and required cues for log roll technique.   Transfers Overall transfer level: Needs assistance Equipment used: Rolling walker (2 wheeled) Transfers: Sit to/from Stand Sit to Stand: Min guard         General transfer comment: Min guard A for steadying assist.   Ambulation/Gait Ambulation/Gait assistance: Min guard Gait Distance (Feet): 100 Feet(50', 50') Assistive device: Rolling walker (2 wheeled) Gait Pattern/deviations:  Step-through pattern;Decreased stride length Gait velocity: Decreased    General Gait Details: Slow, shaky gait. Required min guard A for steadying assist. Pt with increased nausea in the middle of gait and required seated rest. Educated about generalized walking program to perform at home.   Stairs            Wheelchair Mobility    Modified Rankin (Stroke Patients Only)       Balance Overall balance assessment: Needs assistance Sitting-balance support: No upper extremity supported;Feet supported Sitting balance-Leahy Scale: Fair     Standing balance support: Bilateral upper extremity supported;During functional activity Standing balance-Leahy Scale: Poor Standing balance comment: Reliant on UE support                              Pertinent Vitals/Pain Pain Assessment: 0-10 Pain Score: 10-Worst pain ever Pain Location: back with movement  Pain Descriptors / Indicators: Aching;Operative site guarding Pain Intervention(s): Monitored during session;Limited activity within patient's tolerance;Repositioned    Home Living Family/patient expects to be discharged to:: Private residence Living Arrangements: Spouse/significant other Available Help at Discharge: Family;Available 24 hours/day Type of Home: House Home Access: Stairs to enter Entrance Stairs-Rails: None Entrance Stairs-Number of Steps: 3 Home Layout: One level Home Equipment: None      Prior Function Level of Independence: Independent               Hand Dominance        Extremity/Trunk Assessment   Upper Extremity Assessment Upper Extremity Assessment: Defer to OT evaluation    Lower Extremity Assessment Lower Extremity Assessment: Generalized weakness    Cervical / Trunk Assessment Cervical / Trunk Assessment: Normal  Communication  Communication: No difficulties  Cognition Arousal/Alertness: Awake/alert Behavior During Therapy: WFL for tasks assessed/performed Overall  Cognitive Status: Within Functional Limits for tasks assessed                                        General Comments General comments (skin integrity, edema, etc.): Pt's husband present during session.     Exercises     Assessment/Plan    PT Assessment Patient needs continued PT services  PT Problem List Decreased strength;Decreased balance;Decreased mobility;Decreased activity tolerance;Decreased knowledge of use of DME;Decreased knowledge of precautions;Pain       PT Treatment Interventions DME instruction;Gait training;Stair training;Therapeutic activities;Functional mobility training;Therapeutic exercise;Balance training;Patient/family education    PT Goals (Current goals can be found in the Care Plan section)  Acute Rehab PT Goals Patient Stated Goal: to decrease pain  PT Goal Formulation: With patient Time For Goal Achievement: 02/23/18 Potential to Achieve Goals: Good    Frequency Min 5X/week   Barriers to discharge        Co-evaluation               AM-PAC PT "6 Clicks" Mobility  Outcome Measure Help needed turning from your back to your side while in a flat bed without using bedrails?: A Little Help needed moving from lying on your back to sitting on the side of a flat bed without using bedrails?: A Little Help needed moving to and from a bed to a chair (including a wheelchair)?: A Little Help needed standing up from a chair using your arms (e.g., wheelchair or bedside chair)?: A Little Help needed to walk in hospital room?: A Little Help needed climbing 3-5 steps with a railing? : A Lot 6 Click Score: 17    End of Session Equipment Utilized During Treatment: Gait belt Activity Tolerance: Patient limited by pain Patient left: in bed;with call bell/phone within reach;with family/visitor present Nurse Communication: Mobility status PT Visit Diagnosis: Unsteadiness on feet (R26.81);Pain Pain - part of body: (back )    Time:  9826-4158 PT Time Calculation (min) (ACUTE ONLY): 20 min   Charges:   PT Evaluation $PT Eval Low Complexity: 1 Low          Gladys Damme, PT, DPT  Acute Rehabilitation Services  Pager: [phone] Office: [phone]   Lehman Prom 02/09/2018, 3:33 PM

## 2018-02-09 NOTE — Transfer of Care (Signed)
Immediate Anesthesia Transfer of Care Note  Patient: Courtney Brooks  Procedure(s) Performed: LUMBAR SPINAL CORD STIMULATOR INSERTION (N/A Back)  Patient Location: PACU  Anesthesia Type:General  Level of Consciousness: awake, alert  and oriented  Airway & Oxygen Therapy: Patient Spontanous Breathing and Patient connected to nasal cannula oxygen  Post-op Assessment: Report given to RN and Post -op Vital signs reviewed and stable  Post vital signs: Reviewed and stable  Last Vitals:  Vitals Value Taken Time  BP 124/81 02/09/2018 10:05 AM  Temp    Pulse 108 02/09/2018 10:06 AM  Resp 19 02/09/2018 10:06 AM  SpO2 97 % 02/09/2018 10:06 AM  Vitals shown include unvalidated device data.  Last Pain:  Vitals:   02/09/18 0645  TempSrc:   PainSc: 8          Complications: No apparent anesthesia complications

## 2018-02-09 NOTE — H&P (Addendum)
HPI Notes: 02/09/18 SCS Placement Courtney Brooks is a very pleasant 48 year old female With no significant medical history who is scheduled for spinal cord stimulator placement on 02/09/2018 at [facility] with Dr. Shon Baton to treat her Chronic pain syndrome. She has had a prior lumbar laminectomy. Her main complaint is low back pain with bilateral leg pain left greater than right. She has obtained preoperative clearance from her PCP. She has been a [facility] segment as well. No DME required.  Allergies PERCOCET: Itching   Medications Aleve Valium 10 mg tablet  Problems Herniation of lumbar intervertebral disc with sciatica - Onset: 05/23/2017 - left L3-4 foraminal  Surgical History Appendectomy - 01/05/2004 GYN History  Physical Exam Patient is a 48 year old female.  General: AAOX3, well developed and well nourished, NAD Heart: RRR, nbo rubs, murmers, or gallops  Lungs: CTAB  Abdomen: Normal bowel soundsX4, soft, non-tender, no hepatosplenomegaly.  ROM: -Spine: normal ROM, pain elicited with fwd flexion and extension.  - Knee: flexion and extension normal and pain free bilaterally.  - Ankle: Dorsiflexion, plantarflexion, inversion, eversion normal and pain free.  Dermatomes: Lower extremity sensation to light touch intact bilaterally with positive dysathesias on the left.  Myotomes:  - Hip Flexion: Left 5/5, Right 5/5  -Hip Adduction: Left 5/5, Right 5/5  - Knee Extension: Left 5/5, Right 5/5  - Knee Flexion: Left 5/5, Right 5/5  - Ankle Dorsiflextion: Left 5/5, Right 5/5  - Ankle Eversion: Left 5/5, Right 5/5  - Ankle Plantarflexion: Left 5/5, Right 5/5  Reflexes:  - Patella: Left2+, Right 2+  - Achilles: Left2+, Right 2+  - Babinski: Left Ngative, Right Negative  - Clonus: Negative  Special Tests:   PV: Extremities warm and well profused. Posterior and dorsalis pedis pulse 2+ bilaterally, No pitting Edema, discoloration, calf tenderness, or  palpable cords. Homan's negative bilaterally.    X-Ray impression: No x-rays at today's visit  MRI: Thoracic MRI dated 12/29/17 demonstrates no cord signal changes. No significant stenosis. No significant pathology that would be contraindicated to the spinal cord stimulator placement. Of note there is also ovoid lesions within the right and left lobes of the liver suggestive of simple cysts or hemangiomas. There is also a small ovoid lesion in the spleen possibly simple cyst or hemangioma. I have given a copy of the MRI to the patient to discuss this with her primary care physician to determine further workup and evaluation is required  Assessment / Plan Courtney Brooks is a very pleasant 48 year old female With no significant medical history who is scheduled for spinal cord stimulator placement on 02/09/2018 at [facility] with Dr. Shon Baton to treat her Chronic pain syndrome. She has had a previous lumbar laminectomy. At this time she mainly complains of low back pain with occasional leg pain left greater than right. Her physical exam today shows no progressing neurological deficits. Thoracic MRI dated 12/29/17 demonstrates no cord signal changes. No significant stenosis. No significant pathology that would be contraindicated to the spinal cord stimulator placement. She has undergone a spinal cord stimulator trial which She was improvement in her pain as well as her quality-of-life.  I have gone over the surgical procedure in great detail with her and all of her questions were encouraged and addressed. I have also reviewed the risks and benefits and she has expressed understanding.  Risks and benefits of surgery were discussed with the patient. These include: Infection, bleeding, death, stroke, paralysis, ongoing or worse pain, need for additional surgery, leak of  spinal fluid, Failure of the battery requiring reoperation. Inability to place the paddle requiring the surgery to be aborted. Migration of the  lead, failure to obtain results similar to the trial.  Patient will follow-up in office 2 weeks status post SCS placement.  She was last seen in my office on 02/03/2018.  There is been no change in her clinical exam.  She continues to have significant low back buttock and bilateral leg pain left worse than the right.  As result of the ongoing symptoms she is elected to move forward with a spinal cord stimulator placement.  I again reviewed the risks and benefits of surgery and all of her questions were addressed.

## 2018-02-09 NOTE — Op Note (Signed)
Operative report  Preoperative diagnosis: Failed back syndrome with chronic pain  Postoperative diagnosis: Same  Operative procedure: Spinal cord stimulator implantation  Implants: Abbott spine: Proclaim V nonrechargeable battery.  Tri-pole spinal cord stimulator paddle  Complications: None  First assistant: Glynis SmilesAmanda Ward, PA  Indications: This is a very pleasant 48 year old man had a previous lumbar surgery and unfortunately has persistent back buttock and neuropathic leg pain.  During the course of her postoperative recovery she was enrolled in pain management and ultimately had a spinal cord stimulator trial.  Patient noted significant improvement in her back buttock and radicular leg pain during the trial.  As a result she elected to move forward with permanent implantation.  All appropriate risks benefits and alternatives to surgery were discussed and consent was obtained.  Operative report: Patient was brought the operating room placed upon the operating room table.  After successful induction of general anesthesia and endotracheal ovation teds SCDs were applied and she was turned prone onto the Wilson frame.  All bony prominences well-padded and the back was prepped and draped in a standard fashion.  Timeout was taken to confirm patient procedure and all other important data.  Fluoroscopy views were then used to identify the T10 pedicle and the T10-T11 interval spinous process space.  I marked out my incision site and infiltrated with half percent Marcaine.  The battery site on the left gluteal region was also marked out and infiltrated with half percent Marcaine with epi.  The midline thoracic incision was made and sharp dissection was carried out down to the deep fascia.  Deep fascia was sharply incised and I stripped the paraspinal muscles to expose the inferior portion of the T10 spinous process and lamina as well as the entire T11 spinous process and lamina and a portion of the T12.   Self-retaining retractor was placed and expanded so I could clearly see the posterior elements of the thoracic spine.  X-ray was then brought back into the field confirming the T10 pedicle and T10 spinous process.  The spinous process was removed with a double-action [employer] Roger.  Using a 2 mm Kerrison Roger I performed a laminotomy of T10.  I gently dissected through the central raphae of the ligamentum flavum and used a 1 mm and 2 mm Kerrison rondure to remove the omentum flavum and expose the epidural fat.  I gently dissected with my Penfield 4 through the epidural fat to expose the dorsal surface of the thecal sac.  Once this was exposed I opened the tri-pole paddle and gently advanced it in the epidural space.  Inferiorly advanced up to about the superior portion of T8.  Because she is having increased left sided pain I did make sure that the paddle was slightly more to the left-hand side of midline.  Imaging studies in the AP and lateral plane confirmed satisfactory placement of the paddle.  The leads were then secured directly to the spinous process of T10 with a #1 Ethibond suture.  I then wrapped around the T11 spinous process for additional security.  The gluteal incision was made and I bluntly dissected down to create a pocket approximately 3 cm deep.  Using a submuscular passer I advanced the leads from the thoracic wound to the gluteal wound.  The leads were then connected to the battery and the locking knot was torqued according manufacturer standards  The assess lead was wrapped and placed on the undersurface of the battery and the battery was placed into the pocket.  I secured the battery directly to the deep fascia with two #1 Vicryl sutures.  The battery was then tested and according to the representative all leads were working without difficulty.  At this point both wounds were copiously irrigated with normal saline.  Prior to placing the battery on the field I did make sure that hemostasis  using bipolar cautery and FloSeal.  After the battery was inserted the bipolar cautery were disconnected to prevent iatrogenic injury to the battery.  With hemostasis obtained I irrigated finally and closed both wounds in a layered fashion with interrupted #1 Vicryl suture, 2-0 Vicryl suture, and 3-0 Monocryl for the skin.  Steri-Strips and dry dressings were applied and the patient was ultimately extubated transfer the PACU that incident.  The end of the case all needle sponge counts were correct.  There were no adverse intraoperative events

## 2018-02-09 NOTE — Brief Op Note (Signed)
02/09/2018  9:29 AM  PATIENT:  Courtney Brooks  48 y.o. female  PRE-OPERATIVE DIAGNOSIS:  Chronic pain syndrome,Prior lumbar laminotomy  POST-OPERATIVE DIAGNOSIS:  Chronic pain syndrome,Prior lumbar laminotomy  PROCEDURE:  Procedure(s): LUMBAR SPINAL CORD STIMULATOR INSERTION (N/A)  SURGEON:  Surgeon(s) and Role:    Venita Lick, MD - Primary  PHYSICIAN ASSISTANT:   ASSISTANTS: Amanda Ward, PA   ANESTHESIA:   general  EBL:  50 mL   BLOOD ADMINISTERED:none  DRAINS: none   LOCAL MEDICATIONS USED:  MARCAINE     SPECIMEN:  No Specimen  DISPOSITION OF SPECIMEN:  N/A  COUNTS:  YES  TOURNIQUET:  * No tourniquets in log *  DICTATION: .Dragon Dictation  PLAN OF CARE: Admit for overnight observation  PATIENT DISPOSITION:  PACU - hemodynamically stable.

## 2018-02-09 NOTE — Discharge Instructions (Signed)

## 2018-02-09 NOTE — Anesthesia Procedure Notes (Signed)
Procedure Name: Intubation Date/Time: 02/09/2018 7:45 AM Performed by: Trinna Post., CRNA Pre-anesthesia Checklist: Patient identified, Emergency Drugs available, Suction available, Patient being monitored and Timeout performed Patient Re-evaluated:Patient Re-evaluated prior to induction Oxygen Delivery Method: Circle system utilized Preoxygenation: Pre-oxygenation with 100% oxygen Induction Type: IV induction Ventilation: Mask ventilation without difficulty Laryngoscope Size: Mac and 3 Grade View: Grade I Tube type: Oral Tube size: 7.0 mm Number of attempts: 1 Airway Equipment and Method: Stylet Placement Confirmation: ETT inserted through vocal cords under direct vision,  positive ETCO2 and breath sounds checked- equal and bilateral Secured at: 22 cm Tube secured with: Tape Dental Injury: Teeth and Oropharynx as per pre-operative assessment

## 2018-02-09 NOTE — Anesthesia Postprocedure Evaluation (Signed)
Anesthesia Post Note  Patient: Courtney Brooks  Procedure(s) Performed: LUMBAR SPINAL CORD STIMULATOR INSERTION (N/A Back)     Patient location during evaluation: PACU Anesthesia Type: General Level of consciousness: awake Pain management: pain level controlled Vital Signs Assessment: post-procedure vital signs reviewed and stable Respiratory status: spontaneous breathing Cardiovascular status: stable Postop Assessment: no apparent nausea or vomiting Anesthetic complications: no    Last Vitals:  Vitals:   02/09/18 0609  BP: 116/85  Pulse: 90  Resp: 20  Temp: 36.8 C  SpO2: 98%    Last Pain:  Vitals:   02/09/18 0645  TempSrc:   PainSc: 8                  Maliki Gignac

## 2018-02-10 ENCOUNTER — Encounter (HOSPITAL_COMMUNITY): Payer: Self-pay | Admitting: Orthopedic Surgery

## 2018-02-10 DIAGNOSIS — Z791 Long term (current) use of non-steroidal anti-inflammatories (NSAID): Secondary | ICD-10-CM | POA: Diagnosis not present

## 2018-02-10 DIAGNOSIS — M961 Postlaminectomy syndrome, not elsewhere classified: Secondary | ICD-10-CM | POA: Diagnosis not present

## 2018-02-10 DIAGNOSIS — Z79899 Other long term (current) drug therapy: Secondary | ICD-10-CM | POA: Diagnosis not present

## 2018-02-10 DIAGNOSIS — Z7951 Long term (current) use of inhaled steroids: Secondary | ICD-10-CM | POA: Diagnosis not present

## 2018-02-10 NOTE — Progress Notes (Signed)
    Subjective: Procedure(s) (LRB): LUMBAR SPINAL CORD STIMULATOR INSERTION (N/A) 1 Day Post-Op  Patient reports pain as 2 on 0-10 scale.  Reports decreased leg pain reports incisional back pain   Positive void Negative bowel movement Positive flatus Negative chest pain or shortness of breath  Objective: Vital signs in last 24 hours: Temp:  [97.3 F (36.3 C)-98.1 F (36.7 C)] 98 F (36.7 C) (02/07 0734) Pulse Rate:  [77-103] 87 (02/07 0734) Resp:  [14-21] 16 (02/07 0734) BP: (109-134)/(75-87) 109/75 (02/07 0734) SpO2:  [90 %-100 %] 100 % (02/07 0734)  Intake/Output from previous day: 02/06 0701 - 02/07 0700 In: 1631 [P.O.:100; I.V.:1231; IV Piggyback:100] Out: 50 [Blood:50]  Labs: No results for input(s): WBC, RBC, HCT, PLT in the last 72 hours. No results for input(s): NA, K, CL, CO2, BUN, CREATININE, GLUCOSE, CALCIUM in the last 72 hours. No results for input(s): LABPT, INR in the last 72 hours.  Physical Exam: Neurologically intact ABD soft Intact pulses distally Dorsiflexion/Plantar flexion intact Incision: dressing C/D/I No cellulitis present Compartment soft Body mass index is 27.98 kg/m.   Assessment/Plan: Patient stable  xrays n/a Continue mobilization with physical therapy Continue care  Advance diet Up with therapy  Doing well Plan on d/c to home - f/u in 2 weeks  Venita Lick, MD [facility] [phone]

## 2018-02-10 NOTE — Evaluation (Signed)
Occupational Therapy Evaluation Patient Details Name: Courtney Brooks MRN: [id number] DOB: 10/28/1970 Today's Date: 02/10/2018    History of Present Illness Pt is a 48 y/o female s/p lumbar spinal cord stimulator. PMH includes lumbar surgery.    Clinical Impression   Pt PTAL living at home with spouse, independent with ADL and mobility. Pt currently independent with ADL using figure 4 technique with no cues for back precautions required. Back handout provided and reviewed adls in detail. Pt educated on: set an alarm at night for medication, avoid sitting for long periods of time, correct bed positioning for sleeping, correct sequence for bed mobility, avoiding lifting more than 5 pounds and never wash directly over incision. All education is complete and patient indicates understanding.  Pt requires 3in1 commode for increased safety and ease of transfers to double as shower chair. No follow-up OT needs required at this time. OT to sign off.      Follow Up Recommendations  No OT follow up    Equipment Recommendations  3 in 1 bedside commode    Recommendations for Other Services       Precautions / Restrictions Precautions Precautions: Back Precaution Booklet Issued: Yes (comment) Precaution Comments: Reviewed back precautions with pt.  Restrictions Weight Bearing Restrictions: No      Mobility Bed Mobility Overal bed mobility: Modified Independent             General bed mobility comments: performed log rolling technique  Transfers Overall transfer level: Modified independent               General transfer comment: No assist required today. Fair balance.    Balance Overall balance assessment: Modified Independent Sitting-balance support: No upper extremity supported       Standing balance support: During functional activity Standing balance-Leahy Scale: Fair                             ADL either performed or assessed with clinical judgement    ADL Overall ADL's : At baseline                                       General ADL Comments: Pt performing figure 4 technique for LB dressing. Pt performing tasks at sink with independence. Pt has low commodes at home and having BSC will improve independence and safety.     Vision Baseline Vision/History: No visual deficits Patient Visual Report: No change from baseline Vision Assessment?: No apparent visual deficits     Perception     Praxis      Pertinent Vitals/Pain Pain Assessment: 0-10 Pain Score: 3  Pain Descriptors / Indicators: Aching;Operative site guarding Pain Intervention(s): Monitored during session     Hand Dominance     Extremity/Trunk Assessment Upper Extremity Assessment Upper Extremity Assessment: Defer to OT evaluation   Lower Extremity Assessment Lower Extremity Assessment: Generalized weakness   Cervical / Trunk Assessment Cervical / Trunk Assessment: Normal   Communication Communication Communication: No difficulties   Cognition Arousal/Alertness: Awake/alert Behavior During Therapy: WFL for tasks assessed/performed Overall Cognitive Status: Within Functional Limits for tasks assessed                                     General Comments  Exercises     Shoulder Instructions      Home Living Family/patient expects to be discharged to:: Private residence Living Arrangements: Spouse/significant other Available Help at Discharge: Family;Available 24 hours/day Type of Home: House Home Access: Stairs to enter [employer] of Steps: 3 Entrance Stairs-Rails: None Home Layout: One level     Bathroom Shower/Tub: Chief Strategy Officer: Standard     Home Equipment: None          Prior Functioning/Environment Level of Independence: Independent                 OT Problem List: Decreased strength;Pain      OT Treatment/Interventions:      OT Goals(Current goals can  be found in the care plan section) Acute Rehab OT Goals Patient Stated Goal: to decrease pain   OT Frequency:     Barriers to D/C:            Co-evaluation              AM-PAC OT "6 Clicks" Daily Activity     Outcome Measure Help from another person eating meals?: None Help from another person taking care of personal grooming?: None Help from another person toileting, which includes using toliet, bedpan, or urinal?: None Help from another person bathing (including washing, rinsing, drying)?: None Help from another person to put on and taking off regular upper body clothing?: None Help from another person to put on and taking off regular lower body clothing?: None 6 Click Score: 24   End of Session Nurse Communication: Mobility status  Activity Tolerance: Patient tolerated treatment well Patient left: in bed;with call bell/phone within reach  OT Visit Diagnosis: Unsteadiness on feet (R26.81);Muscle weakness (generalized) (M62.81)                Time: 5465-0354 OT Time Calculation (min): 15 min Charges:  OT General Charges $OT Visit: 1 Visit OT Evaluation $OT Eval Moderate Complexity: 1 Mod  Courtney Brooks) Courtney Brooks OTR/L Acute Rehabilitation Services Pager: [phone] Office: [phone]   Sandrea Hughs 02/10/2018, 8:24 AM

## 2018-02-10 NOTE — Progress Notes (Signed)
Physical Therapy Treatment Patient Details Name: Courtney Brooks MRN: [id number] DOB: 05/10/1970 Today's Date: 02/10/2018    History of Present Illness Pt is a 48 y/o female s/p lumbar spinal cord stimulator. PMH includes lumbar surgery.     PT Comments    Pt progressing towards physical therapy goals. Was able to perform transfers and ambulation with gross modified independence and no AD. Pt and husband were educated on safe stair negotiation, car transfer, activity progression, and positioning recommendations. Will sign off at this time as pt has med acute PT goals. If needs change, please reconsult.    Follow Up Recommendations  No PT follow up;Supervision for mobility/OOB     Equipment Recommendations  Rolling walker with 5" wheels;3in1 (PT)    Recommendations for Other Services       Precautions / Restrictions Precautions Precautions: Back Precaution Booklet Issued: Yes (comment) Precaution Comments: Reviewed back precautions with pt.  Restrictions Weight Bearing Restrictions: No    Mobility  Bed Mobility Overal bed mobility: Modified Independent             General bed mobility comments: Pt sitting up EOB when PT arrived.   Transfers Overall transfer level: Modified independent Equipment used: None             General transfer comment: Pt demonstrated proper hand placement on seated surface for safety. No assist required.   Ambulation/Gait Ambulation/Gait assistance: Modified independent (Device/Increase time) Gait Distance (Feet): 300 Feet Assistive device: Rolling walker (2 wheeled) Gait Pattern/deviations: Step-through pattern;Decreased stride length Gait velocity: Decreased  Gait velocity interpretation: 1.31 - 2.62 ft/sec, indicative of limited community ambulator General Gait Details: Slow but generally steady without AD. Noted B knees appeared slightly flexed throughout gait training. No overt LOB noted.    Stairs Stairs: Yes Stairs assistance:  Min guard with husband; supervision from therapist Stair Management: No rails;Step to pattern;Forwards Number of Stairs: 10 General stair comments: Husband present for education. PT instructed pt in sequencing and general safety, and husband educated on proper guarding techniques.    Wheelchair Mobility    Modified Rankin (Stroke Patients Only)       Balance Overall balance assessment: Modified Independent Sitting-balance support: No upper extremity supported Sitting balance-Leahy Scale: Fair     Standing balance support: During functional activity Standing balance-Leahy Scale: Fair Standing balance comment: Reliant on UE support                             Cognition Arousal/Alertness: Awake/alert Behavior During Therapy: WFL for tasks assessed/performed Overall Cognitive Status: Within Functional Limits for tasks assessed                                        Exercises      General Comments        Pertinent Vitals/Pain Pain Assessment: Faces Pain Score: 3  Faces Pain Scale: Hurts a little bit Pain Location: back with movement  Pain Descriptors / Indicators: Aching;Operative site guarding Pain Intervention(s): Monitored during session    Home Living Family/patient expects to be discharged to:: Private residence Living Arrangements: Spouse/significant other Available Help at Discharge: Family;Available 24 hours/day Type of Home: House Home Access: Stairs to enter Entrance Stairs-Rails: None Home Layout: One level Home Equipment: None      Prior Function Level of Independence: Independent  PT Goals (current goals can now be found in the care plan section) Acute Rehab PT Goals Patient Stated Goal: Home today PT Goal Formulation: With patient Time For Goal Achievement: 02/23/18 Potential to Achieve Goals: Good Progress towards PT goals: Progressing toward goals    Frequency    Min 5X/week      PT Plan  Current plan remains appropriate    Co-evaluation              AM-PAC PT "6 Clicks" Mobility   Outcome Measure  Help needed turning from your back to your side while in a flat bed without using bedrails?: A Little Help needed moving from lying on your back to sitting on the side of a flat bed without using bedrails?: A Little Help needed moving to and from a bed to a chair (including a wheelchair)?: A Little Help needed standing up from a chair using your arms (e.g., wheelchair or bedside chair)?: A Little Help needed to walk in hospital room?: A Little Help needed climbing 3-5 steps with a railing? : A Lot 6 Click Score: 17    End of Session Equipment Utilized During Treatment: Gait belt Activity Tolerance: Patient limited by pain Patient left: in bed;with call bell/phone within reach;with family/visitor present Nurse Communication: Mobility status PT Visit Diagnosis: Unsteadiness on feet (R26.81);Pain Pain - part of body: (back )     Time: 1962-2297 PT Time Calculation (min) (ACUTE ONLY): 9 min  Charges:  $Gait Training: 8-22 mins                     Conni Slipper, PT, DPT Acute Rehabilitation Services Pager: [phone] Office: [phone]    Marylynn Pearson 02/10/2018, 8:55 AM

## 2018-02-10 NOTE — Progress Notes (Signed)
Patient is discharged from room 3C05 at this time. Alert and in stable condition. IV site d/c'd and instructions read to patient and spouse with understanding verbalized. Left unit via wheelchair with all belongings at side. 

## 2018-02-28 NOTE — Discharge Summary (Signed)
Patient ID: Courtney Brooks MRN: [id number] DOB/AGE: 50-Nov-1972 50 y.o.  Admit date: 02/09/2018 Discharge date: 02/28/2018  Admission Diagnoses:  Active Problems:   Chronic pain   Discharge Diagnoses:  Active Problems:   Chronic pain  status post Procedure(s): LUMBAR SPINAL CORD STIMULATOR INSERTION  Past Medical History:  Diagnosis Date  . Endometriosis   . Family history of breast cancer    [facility] testing letter sent 2017  . GERD (gastroesophageal reflux disease)   . Hx: UTI (urinary tract infection)     Surgeries: Procedure(s): LUMBAR SPINAL CORD STIMULATOR INSERTION on 02/09/2018   Consultants: None  Discharged Condition: Improved  Hospital Course: Courtney Brooks is an 48 y.o. female who was admitted 02/09/2018 for operative treatment of chronic back pain. Patient failed conservative treatments (please see the history and physical for the specifics) and had severe unremitting pain that affects sleep, daily activities and work/hobbies. After pre-op clearance, the patient was taken to the operating room on 02/09/2018 and underwent  Procedure(s): [place].    Patient was given perioperative antibiotics:  Anti-infectives (From admission, onward)   Start     Dose/Rate Route Frequency Ordered Stop   02/09/18 1530  ceFAZolin (ANCEF) IVPB 1 g/50 mL premix     1 g 100 mL/hr over 30 Minutes Intravenous Every 8 hours 02/09/18 1109 02/09/18 2325   02/09/18 0627  ceFAZolin (ANCEF) IVPB 2g/100 mL premix     2 g 200 mL/hr over 30 Minutes Intravenous 30 min pre-op 02/09/18 2992 02/09/18 0818       Patient was given sequential compression devices and early ambulation to prevent DVT.   Patient benefited maximally from hospital stay and there were no complications. At the time of discharge, the patient was urinating/moving their bowels without difficulty, tolerating a regular diet, pain is controlled with oral pain medications and they have been cleared by  PT/OT.   Recent vital signs: No data found.   Recent laboratory studies: No results for input(s): WBC, HGB, HCT, PLT, NA, K, CL, CO2, BUN, CREATININE, GLUCOSE, INR, CALCIUM in the last 72 hours.  Invalid input(s): PT, 2   Discharge Medications:   Allergies as of 02/10/2018      Reactions   Percocet [oxycodone-acetaminophen] Itching   Nose itches      Medication List    STOP taking these medications   ibuprofen 200 MG tablet Commonly known as:  ADVIL,MOTRIN   naproxen 500 MG tablet Commonly known as:  NAPROSYN     TAKE these medications   fluticasone 50 MCG/ACT nasal spray Commonly known as:  FLONASE Place 2 sprays into both nostrils daily. What changed:    when to take this  reasons to take this   ondansetron 4 MG tablet Commonly known as:  ZOFRAN Take 1 tablet (4 mg total) by mouth every 8 (eight) hours as needed for nausea or vomiting.     ASK your doctor about these medications   methocarbamol 500 MG tablet Commonly known as:  ROBAXIN Take 1 tablet (500 mg total) by mouth every 8 (eight) hours as needed for up to 5 days for muscle spasms. Ask about: Should I take this medication?   oxyCODONE-acetaminophen 10-325 MG tablet Commonly known as:  PERCOCET Take 1 tablet by mouth every 6 (six) hours as needed for up to 5 days for pain. Ask about: Should I take this medication?       Diagnostic Studies: Dg Thoracolumabar Spine  Result Date: 02/09/2018 CLINICAL  DATA:  Intraspinal stimulator placement T8-T9 EXAM: DG C-ARM 61-120 MIN; THORACOLUMBAR SPINE - 2 VIEW COMPARISON:  None FLUOROSCOPY TIME:  0 minutes 21 seconds Images submitted: 3 FINDINGS: Bones appear demineralized. Intraspinal stimulator is identified at the mid to lower thoracic spine dorsally. This appears to be at T8-T9. No other osseous abnormality seen. IMPRESSION: Intraspinal stimulator placement at T8-T9. Electronically Signed   By: Lavonia Dana M.D.   On: 02/09/2018 10:35   Dg C-arm 1-60  Min  Result Date: 02/09/2018 CLINICAL DATA:  Intraspinal stimulator placement T8-T9 EXAM: DG C-ARM 61-120 MIN; THORACOLUMBAR SPINE - 2 VIEW COMPARISON:  None FLUOROSCOPY TIME:  0 minutes 21 seconds Images submitted: 3 FINDINGS: Bones appear demineralized. Intraspinal stimulator is identified at the mid to lower thoracic spine dorsally. This appears to be at T8-T9. No other osseous abnormality seen. IMPRESSION: Intraspinal stimulator placement at T8-T9. Electronically Signed   By: Lavonia Dana M.D.   On: 02/09/2018 10:35    Discharge Instructions    Incentive spirometry RT   Complete by:  As directed       Follow-up Information    Melina Schools, MD. Schedule an appointment as soon as possible for a visit today.   Specialty:  Orthopedic Surgery Why:  If symptoms worsen, For suture removal, For wound re-check Contact information: [address] [place] [address] [phone]           Discharge Plan:  discharge to home  Disposition: Stable     Signed: Yvonne Kendall Ward for [facility] PA-C [facility] [phone] 02/28/2018, 8:02 AM

## 2018-03-29 ENCOUNTER — Other Ambulatory Visit: Payer: Self-pay

## 2018-03-29 DIAGNOSIS — M79642 Pain in left hand: Secondary | ICD-10-CM

## 2018-05-11 ENCOUNTER — Encounter: Payer: BLUE CROSS/BLUE SHIELD | Admitting: Diagnostic Neuroimaging

## 2018-05-18 ENCOUNTER — Other Ambulatory Visit: Payer: Self-pay

## 2018-05-18 ENCOUNTER — Encounter: Payer: Self-pay | Admitting: Nurse Practitioner

## 2018-05-18 ENCOUNTER — Ambulatory Visit (INDEPENDENT_AMBULATORY_CARE_PROVIDER_SITE_OTHER): Payer: BLUE CROSS/BLUE SHIELD | Admitting: Nurse Practitioner

## 2018-05-18 DIAGNOSIS — B9789 Other viral agents as the cause of diseases classified elsewhere: Secondary | ICD-10-CM

## 2018-05-18 DIAGNOSIS — J069 Acute upper respiratory infection, unspecified: Secondary | ICD-10-CM

## 2018-05-18 MED ORDER — BENZONATATE 100 MG PO CAPS: 100.0000 mg | ORAL_CAPSULE | Freq: Three times a day (TID) | ORAL | 0 refills | Status: DC | PRN

## 2018-05-18 NOTE — Progress Notes (Signed)
Virtual Visit via telephone Note  I connected with Courtney Brooks on 05/18/18 at 3:00 PM by telephone and verified that I am speaking with the correct person using two identifiers. Courtney Brooks is currently located at home and non one is currently with her during visit. The provider, Mary-Margaret Daphine Deutscher, FNP is located in their office at time of visit.  I discussed the limitations, risks, security and privacy concerns of performing an evaluation and management service by telephone and the availability of in person appointments. I also discussed with the patient that there may be a patient responsible charge related to this service. The patient expressed understanding and agreed to proceed.   History and Present Illness:   Chief Complaint: URI   HPI Patient calls in today c/o URI symptoms. Started as headache Monday and since then has started coughing  And congestion. No fever but is having chills. She has not taking anything other then Ibuprofen. She denies traveling recently or around anyone that is sick.     Review of Systems  Constitutional: Negative for chills and fever.  HENT: Positive for congestion. Negative for sinus pain and sore throat.   Respiratory: Positive for cough.   Cardiovascular: Negative.   Genitourinary: Negative.   Musculoskeletal: Negative.   Neurological: Negative.   Psychiatric/Behavioral: Negative.   All other systems reviewed and are negative.    Observations/Objective: Alert and oriented- answers all questions appropriately No distress noted Deep dry cough during conversation  Assessment and Plan: Courtney Brooks in today with chief complaint of URI   1. Viral upper respiratory tract infection with cough 1. Take meds as prescribed 2. Use a cool mist humidifier especially during the winter months and when heat has been humid. 3. Use saline nose sprays frequently 4. Saline irrigations of the nose can be very helpful if done frequently.  * 4X  daily for 1 week*  * Use of a nettie pot can be helpful with this. Follow directions with this* 5. Drink plenty of fluids 6. Keep thermostat turn down low 7.For any cough or congestion  Use plain Mucinex- regular strength or max strength is fine   * Children- consult with Pharmacist for dosing 8. For fever or aces or pains- take tylenol or ibuprofen appropriate for age and weight.  * for fevers greater than 101 orally you may alternate ibuprofen and tylenol every  3 hours.    - benzonatate (TESSALON PERLES) 100 MG capsule; Take 1 capsule (100 mg total) by mouth 3 (three) times daily as needed for cough.  Dispense: 20 capsule; Refill: 0   Follow Up Instructions:  prn   I discussed the assessment and treatment plan with the patient. The patient was provided an opportunity to ask questions and all were answered. The patient agreed with the plan and demonstrated an understanding of the instructions.   The patient was advised to call back or seek an in-person evaluation if the symptoms worsen or if the condition fails to improve as anticipated.  The above assessment and management plan was discussed with the patient. The patient verbalized understanding of and has agreed to the management plan. Patient is aware to call the clinic if symptoms persist or worsen. Patient is aware when to return to the clinic for a follow-up visit. Patient educated on when it is appropriate to go to the emergency department.   Time call ended:  3:12  I provided 15 minutes of non-face-to-face time during this encounter.  Mary-Margaret Severo Beber, FNP   

## 2018-06-12 ENCOUNTER — Other Ambulatory Visit: Payer: Self-pay

## 2018-06-12 ENCOUNTER — Encounter: Payer: Self-pay | Admitting: Family Medicine

## 2018-06-12 ENCOUNTER — Ambulatory Visit (INDEPENDENT_AMBULATORY_CARE_PROVIDER_SITE_OTHER): Payer: BC Managed Care – PPO | Admitting: Family Medicine

## 2018-06-12 DIAGNOSIS — J209 Acute bronchitis, unspecified: Secondary | ICD-10-CM

## 2018-06-12 MED ORDER — ALBUTEROL SULFATE HFA 108 (90 BASE) MCG/ACT IN AERS: 2.0000 | INHALATION_SPRAY | Freq: Four times a day (QID) | RESPIRATORY_TRACT | 2 refills | Status: DC | PRN

## 2018-06-12 MED ORDER — AMOXICILLIN-POT CLAVULANATE 875-125 MG PO TABS: 1.0000 | ORAL_TABLET | Freq: Two times a day (BID) | ORAL | 0 refills | Status: AC

## 2018-06-12 MED ORDER — BENZONATATE 100 MG PO CAPS: 100.0000 mg | ORAL_CAPSULE | Freq: Three times a day (TID) | ORAL | 0 refills | Status: DC | PRN

## 2018-06-12 MED ORDER — PREDNISONE 20 MG PO TABS: ORAL_TABLET | ORAL | 0 refills | Status: DC

## 2018-06-12 NOTE — Progress Notes (Signed)
Virtual Visit via telephone Note Due to COVID-19, visit is conducted virtually and was requested by patient. This visit type was conducted due to national recommendations for restrictions regarding the COVID-19 Pandemic (e.g. social distancing) in an effort to limit this patient's exposure and mitigate transmission in our community. All issues noted in this document were discussed and addressed.  A physical exam was not performed with this format.   I connected with Courtney Brooks on 06/12/18 at 1225 by telephone and verified that I am speaking with the correct person using two identifiers. Courtney Brooks is currently located at home and family is currently with them during visit. The provider, Monia Pouch, FNP is located in their office at time of visit.  I discussed the limitations, risks, security and privacy concerns of performing an evaluation and management service by telephone and the availability of in person appointments. I also discussed with the patient that there may be a patient responsible charge related to this service. The patient expressed understanding and agreed to proceed.  Subjective:  Patient ID: Courtney Brooks, female    DOB: 18-Jan-1970, 48 y.o.   MRN: [id number]  Chief Complaint:  Cough and Nasal Congestion   HPI: Courtney Brooks is a 48 y.o. female presenting on 06/12/2018 for Cough and Nasal Congestion   Pt reports ongoing cough, congestion, headache, sputum production, low grade fever, and chills. States she was tested for COVID-19 last week and it was negative. She has tried Robitussin and Tylenol without relief of symptoms.   Cough  This is a new problem. The current episode started 1 to 4 weeks ago. The problem has been gradually worsening. The problem occurs every few minutes. The cough is productive of purulent sputum. Associated symptoms include chills, a fever, headaches, myalgias, nasal congestion, postnasal drip and wheezing. Pertinent negatives include no chest  pain, ear congestion, ear pain, heartburn, hemoptysis, rash, rhinorrhea, sore throat, shortness of breath, sweats or weight loss. Nothing aggravates the symptoms. She has tried OTC cough suppressant for the symptoms. The treatment provided no relief.     Relevant past medical, surgical, family, and social history reviewed and updated as indicated.  Allergies and medications reviewed and updated.   Past Medical History:  Diagnosis Date  . Endometriosis   . Family history of breast cancer    BRCA testing letter sent 2017  . GERD (gastroesophageal reflux disease)   . Hx: UTI (urinary tract infection)     Past Surgical History:  Procedure Laterality Date  . APPENDECTOMY    . BACK SURGERY  10/2014  . ENDOMETRIAL ABLATION  03/25/2011  . RIGHT OOPHORECTOMY  2006  . SPINAL CORD STIMULATOR INSERTION N/A 02/09/2018   Procedure: LUMBAR SPINAL CORD STIMULATOR INSERTION;  Surgeon: Melina Schools, MD;  Location: [place];  Service: Orthopedics;  Laterality: N/A;  . TUBAL LIGATION  1993    Social History   Socioeconomic History  . Marital status: Married    Spouse name: Not on file  . Number of children: 2  . Years of education: Not on file  . Highest education level: Not on file  Occupational History  . Occupation: Educational psychologist  Social Needs  . Financial resource strain: Not on file  . Food insecurity:    Worry: Not on file    Inability: Not on file  . Transportation needs:    Medical: Not on file    Non-medical: Not on file  Tobacco Use  . Smoking status: Never Smoker  .  Smokeless tobacco: Never Used  Substance and Sexual Activity  . Alcohol use: Yes    Alcohol/week: 4.0 standard drinks    Types: 4 drink(s) per week    Comment: occ  . Drug use: No  . Sexual activity: Not on file  Lifestyle  . Physical activity:    Days per week: Not on file    Minutes per session: Not on file  . Stress: Not on file  Relationships  . Social connections:    Talks on phone: Not on file     Gets together: Not on file    Attends religious service: Not on file    Active member of club or organization: Not on file    Attends meetings of clubs or organizations: Not on file    Relationship status: Not on file  . Intimate partner violence:    Fear of current or ex partner: Not on file    Emotionally abused: Not on file    Physically abused: Not on file    Forced sexual activity: Not on file  Other Topics Concern  . Not on file  Social History Narrative   Daily caffeine     Outpatient Encounter Medications as of 06/12/2018  Medication Sig  . albuterol (VENTOLIN HFA) 108 (90 Base) MCG/ACT inhaler Inhale 2 puffs into the lungs every 6 (six) hours as needed for wheezing or shortness of breath.  Marland Kitchen amoxicillin-clavulanate (AUGMENTIN) 875-125 MG tablet Take 1 tablet by mouth 2 (two) times daily for 7 days.  . benzonatate (TESSALON PERLES) 100 MG capsule Take 1 capsule (100 mg total) by mouth 3 (three) times daily as needed for cough.  . fluticasone (FLONASE) 50 MCG/ACT nasal spray Place 2 sprays into both nostrils daily. (Patient taking differently: Place 2 sprays into both nostrils daily as needed for allergies. )  . ondansetron (ZOFRAN) 4 MG tablet Take 1 tablet (4 mg total) by mouth every 8 (eight) hours as needed for nausea or vomiting.  . predniSONE (DELTASONE) 20 MG tablet 2 po at sametime daily for 5 days  . [DISCONTINUED] benzonatate (TESSALON PERLES) 100 MG capsule Take 1 capsule (100 mg total) by mouth 3 (three) times daily as needed for cough.   No facility-administered encounter medications on file as of 06/12/2018.     Allergies  Allergen Reactions  . Percocet [Oxycodone-Acetaminophen] Itching    Nose itches    Review of Systems  Constitutional: Positive for chills, fatigue and fever. Negative for activity change, appetite change, diaphoresis, unexpected weight change and weight loss.  HENT: Positive for congestion and postnasal drip. Negative for ear pain,  rhinorrhea, sinus pressure, sinus pain, sneezing and sore throat.   Respiratory: Positive for cough and wheezing. Negative for hemoptysis, chest tightness and shortness of breath.   Cardiovascular: Negative for chest pain, palpitations and leg swelling.  Gastrointestinal: Negative for abdominal pain and heartburn.  Genitourinary: Negative for decreased urine volume and difficulty urinating.  Musculoskeletal: Positive for myalgias. Negative for arthralgias.  Skin: Negative for rash.  Neurological: Positive for headaches. Negative for dizziness, weakness, light-headedness and numbness.  Psychiatric/Behavioral: Negative for confusion.  All other systems reviewed and are negative.        Observations/Objective: No vital signs or physical exam, this was a telephone or virtual health encounter.  Pt alert and oriented, answers all questions appropriately, and able to speak in full sentences.    Assessment and Plan: Yatziri was seen today for cough and nasal congestion.  Diagnoses and all orders for  this visit:  Bronchitis, acute, with bronchospasm COVID-19 test negative. Ongoing symptoms for over 3 weeks. Fever and chills in last few days. Bacterial bronchitis suggested. Will treat with below. Symptomatic care discussed. Report any new or worsening symptoms.  -     amoxicillin-clavulanate (AUGMENTIN) 875-125 MG tablet; Take 1 tablet by mouth 2 (two) times daily for 7 days. -     predniSONE (DELTASONE) 20 MG tablet; 2 po at sametime daily for 5 days -     benzonatate (TESSALON PERLES) 100 MG capsule; Take 1 capsule (100 mg total) by mouth 3 (three) times daily as needed for cough. -     albuterol (VENTOLIN HFA) 108 (90 Base) MCG/ACT inhaler; Inhale 2 puffs into the lungs every 6 (six) hours as needed for wheezing or shortness of breath.     Follow Up Instructions: Return in about 2 weeks (around 06/26/2018), or if symptoms worsen or fail to improve.    I discussed the assessment and  treatment plan with the patient. The patient was provided an opportunity to ask questions and all were answered. The patient agreed with the plan and demonstrated an understanding of the instructions.   The patient was advised to call back or seek an in-person evaluation if the symptoms worsen or if the condition fails to improve as anticipated.  The above assessment and management plan was discussed with the patient. The patient verbalized understanding of and has agreed to the management plan. Patient is aware to call the clinic if symptoms persist or worsen. Patient is aware when to return to the clinic for a follow-up visit. Patient educated on when it is appropriate to go to the emergency department.    I provided 15 minutes of non-face-to-face time during this encounter. The call started at 1225. The call ended at 1240. The other time was used for coordination of care.    Monia Pouch, FNP-C [facility] [address] [place], [place] [address] [phone]

## 2018-07-20 ENCOUNTER — Encounter: Payer: Self-pay | Admitting: Diagnostic Neuroimaging

## 2018-09-14 ENCOUNTER — Other Ambulatory Visit: Payer: Self-pay

## 2018-09-14 ENCOUNTER — Ambulatory Visit (INDEPENDENT_AMBULATORY_CARE_PROVIDER_SITE_OTHER): Payer: BC Managed Care – PPO | Admitting: Diagnostic Neuroimaging

## 2018-09-14 ENCOUNTER — Encounter (INDEPENDENT_AMBULATORY_CARE_PROVIDER_SITE_OTHER): Payer: BC Managed Care – PPO | Admitting: Diagnostic Neuroimaging

## 2018-09-14 DIAGNOSIS — R200 Anesthesia of skin: Secondary | ICD-10-CM | POA: Diagnosis not present

## 2018-09-14 DIAGNOSIS — Z0289 Encounter for other administrative examinations: Secondary | ICD-10-CM

## 2018-09-14 DIAGNOSIS — R202 Paresthesia of skin: Secondary | ICD-10-CM | POA: Diagnosis not present

## 2018-09-26 ENCOUNTER — Other Ambulatory Visit: Payer: Self-pay

## 2018-09-27 ENCOUNTER — Encounter: Payer: Self-pay | Admitting: Family Medicine

## 2018-09-27 ENCOUNTER — Ambulatory Visit (INDEPENDENT_AMBULATORY_CARE_PROVIDER_SITE_OTHER): Payer: BC Managed Care – PPO | Admitting: Family Medicine

## 2018-09-27 VITALS — BP 143/88 | HR 114 | Temp 97.8°F | Resp 18 | Ht 70.0 in | Wt 215.8 lb

## 2018-09-27 DIAGNOSIS — R0989 Other specified symptoms and signs involving the circulatory and respiratory systems: Secondary | ICD-10-CM

## 2018-09-27 DIAGNOSIS — R198 Other specified symptoms and signs involving the digestive system and abdomen: Secondary | ICD-10-CM

## 2018-09-27 DIAGNOSIS — R635 Abnormal weight gain: Secondary | ICD-10-CM | POA: Diagnosis not present

## 2018-09-27 DIAGNOSIS — K219 Gastro-esophageal reflux disease without esophagitis: Secondary | ICD-10-CM | POA: Diagnosis not present

## 2018-09-27 DIAGNOSIS — R09A2 Foreign body sensation, throat: Secondary | ICD-10-CM

## 2018-09-27 DIAGNOSIS — Z23 Encounter for immunization: Secondary | ICD-10-CM | POA: Diagnosis not present

## 2018-09-27 MED ORDER — OMEPRAZOLE 20 MG PO CPDR: 20.0000 mg | DELAYED_RELEASE_CAPSULE | Freq: Every day | ORAL | 3 refills | Status: DC

## 2018-09-27 NOTE — Progress Notes (Signed)
BP (!) 143/88   Pulse (!) 114   Temp 97.8 F (36.6 C) (Temporal)   Resp 18   Ht 5\' 10"  (1.778 m)   Wt 215 lb 12.8 oz (97.9 kg)   SpO2 99%   BMI 30.96 kg/m    Subjective:   Patient ID: , female    DOB: [birth year], 48 y.o.   MRN: [id number]  HPI: Courtney Brooks is a 48 y.o. female presenting on 09/27/2018 for TSH check (Patient states for the last 6-8 months she has been having trouble swolling and feels like something is in her throat)   HPI Patient comes in complaining of difficulty swallowing and feels like things get stuck in her throat increase acid and indigestion and heartburn that is been going on over the past 6 to 8 months but has really worsened over the past couple weeks.  She feels likes food is stuck in her throat but liquids do past fine.  She has tried to start taking some Pepcid but not consistently.  She said over the past few days she has had to wake up with throwing up acid in her throat early in the morning when she wakes up.  Patient denies any fevers or chills.  She denies any cough or congestion or runny nose.  She has been having some weight gain about 13 pounds over the past few months.  Relevant past medical, surgical, family and social history reviewed and updated as indicated. Interim medical history since our last visit reviewed. Allergies and medications reviewed and updated.  Review of Systems  Constitutional: Negative for chills and fever.  HENT: Positive for trouble swallowing and voice change. Negative for congestion, postnasal drip, rhinorrhea, sinus pressure and sinus pain.   Eyes: Negative for visual disturbance.  Respiratory: Negative for cough, chest tightness, shortness of breath and wheezing.   Cardiovascular: Negative for chest pain and leg swelling.  Gastrointestinal: Positive for vomiting. Negative for abdominal pain, diarrhea and nausea.  Musculoskeletal: Negative for back pain and gait problem.  Skin: Negative for rash.   Neurological: Negative for light-headedness and headaches.  Psychiatric/Behavioral: Negative for agitation and behavioral problems.  All other systems reviewed and are negative.   Per HPI unless specifically indicated above   Allergies as of 09/27/2018      Reactions   Percocet [oxycodone-acetaminophen] Itching   Nose itches      Medication List       Accurate as of September 27, 2018 11:52 AM. If you have any questions, ask your nurse or doctor.        STOP taking these medications   benzonatate 100 MG capsule Commonly known as: September 29, 2018 Stopped by: Lawyer Dettinger, MD   predniSONE 20 MG tablet Commonly known as: Deltasone Stopped by: Elige Radon, MD     TAKE these medications   albuterol 108 (90 Base) MCG/ACT inhaler Commonly known as: VENTOLIN HFA Inhale 2 puffs into the lungs every 6 (six) hours as needed for wheezing or shortness of breath.   fluticasone 50 MCG/ACT nasal spray Commonly known as: FLONASE Place 2 sprays into both nostrils daily. What changed:   when to take this  reasons to take this   omeprazole 20 MG capsule Commonly known as: PRILOSEC Take 1 capsule (20 mg total) by mouth daily. Started by: Nils Pyle Dettinger, MD   ondansetron 4 MG tablet Commonly known as: Zofran Take 1 tablet (4 mg total) by mouth every 8 (eight) hours as  needed for nausea or vomiting.   phentermine 37.5 MG capsule Take 37.5 mg by mouth every morning.        Objective:   BP (!) 143/88   Pulse (!) 114   Temp 97.8 F (36.6 C) (Temporal)   Resp 18   Ht 5\' 10"  (1.778 m)   Wt 215 lb 12.8 oz (97.9 kg)   SpO2 99%   BMI 30.96 kg/m   Wt Readings from Last 3 Encounters:  09/27/18 215 lb 12.8 oz (97.9 kg)  02/09/18 195 lb (88.5 kg)  01/31/18 207 lb (93.9 kg)    Physical Exam Vitals signs and nursing note reviewed.  Constitutional:      General: She is not in acute distress.    Appearance: She is well-developed. She is not diaphoretic.   Eyes:     Conjunctiva/sclera: Conjunctivae normal.  Cardiovascular:     Rate and Rhythm: Normal rate and regular rhythm.     Heart sounds: Normal heart sounds. No murmur.  Pulmonary:     Effort: Pulmonary effort is normal. No respiratory distress.     Breath sounds: Normal breath sounds. No wheezing.  Abdominal:     General: Abdomen is flat. Bowel sounds are normal. There is no distension.     Tenderness: There is no abdominal tenderness. There is no right CVA tenderness, left CVA tenderness, guarding or rebound.  Musculoskeletal: Normal range of motion.        General: No tenderness.  Skin:    General: Skin is warm and dry.     Findings: No rash.  Neurological:     Mental Status: She is alert and oriented to person, place, and time.     Coordination: Coordination normal.  Psychiatric:        Behavior: Behavior normal.       Assessment & Plan:   Problem List Items Addressed This Visit    None    Visit Diagnoses    Globus sensation    -  Primary   Relevant Medications   omeprazole (PRILOSEC) 20 MG capsule   Gastroesophageal reflux disease without esophagitis       Relevant Medications   omeprazole (PRILOSEC) 20 MG capsule   Weight gain       Relevant Orders   TSH      Will check TSH, recommended for her to take omeprazole every morning 30 minutes before breakfast and take the Pepcid in the evening and if not worsened over the next couple weeks then give [place] call back. Follow up plan: Return if symptoms worsen or fail to improve, for Return in 2 to 3 weeks for recheck if not improved.  Counseling provided for all of the vaccine components Orders Placed This Encounter  Procedures  . TSH    Caryl Pina, MD [facility] 09/27/2018, 11:52 AM

## 2018-09-28 LAB — TSH: TSH: 1.67 u[IU]/mL (ref 0.450–4.500)

## 2018-09-28 NOTE — Procedures (Signed)
[facility]  NCS (NERVE CONDUCTION STUDY) WITH EMG (ELECTROMYOGRAPHY) REPORT   STUDY DATE: 09/14/18 PATIENT NAME: Courtney Brooks DOB: [birth year] MRN: [id number]  ORDERING CLINICIAN: J Dettinger  TECHNOLOGIST: Durenda Age ELECTROMYOGRAPHER: Glenford Bayley. , MD  CLINICAL INFORMATION: 48 year old female with bilateral hand numbness (left > right).  FINDINGS:  NERVE CONDUCTION STUDY: Bilateral median and ulnar motor responses are normal.  Bilateral ulnar F-wave latencies are normal.  Bilateral ulnar sensory spots is of slightly prolonged peak latencies and normal amplitudes.  Left median sensory response is prolonged peak latency and normal amplitude.  Right median sensory response is normal.  Bilateral median to ulnar transcarpal mixed nerve comparisons are normal.  Bilateral radial sensory responses are normal.   NEEDLE ELECTROMYOGRAPHY:  Needle examination of left upper extremity deltoid, biceps, triceps, flexor carpi radialis, first dorsal interosseous is normal.   IMPRESSION:   This study demonstrates: - Mild abnormalities of bilateral ulnar and left median sensory nerve responses, may reflect underlying mild axonal sensory neuropathy.  - Motor nerve responses and needle EMG are normal.    INTERPRETING PHYSICIAN:  Suanne Marker, MD Certified in Neurology, Neurophysiology and Neuroimaging  [facility] [address], [address] [place], [place] [address] [phone]   MNC    Nerve / Sites Muscle Latency Ref. Amplitude Ref. Rel Amp Segments Distance Velocity Ref. Area    ms ms mV mV %  cm m/s m/s mVms  L Median - APB     Wrist APB 3.7 ?4.4 9.4 ?4.0 100 Wrist - APB 7   42.0     Upper arm APB 7.9  8.5  90.8 Upper arm - Wrist 23 55 ?49 39.3  R Median - APB     Wrist APB 3.4 ?4.4 12.5 ?4.0 100 Wrist - APB 7   50.6     Upper arm APB 7.3  12.1  96.8 Upper arm - Wrist 23 59 ?49 50.3  L Ulnar - ADM     Wrist ADM 3.2 ?3.3  11.3 ?6.0 100 Wrist - ADM 7   45.3     B.Elbow ADM 6.8  10.7  94.6 B.Elbow - Wrist 22 61 ?49 46.1     A.Elbow ADM 8.5  10.5  98.4 A.Elbow - B.Elbow 10 60 ?49 44.1         A.Elbow - Wrist      R Ulnar - ADM     Wrist ADM 3.0 ?3.3 11.3 ?6.0 100 Wrist - ADM 7   38.2     B.Elbow ADM 6.5  10.6  93.2 B.Elbow - Wrist 21 60 ?49 37.6     A.Elbow ADM 8.2  10.6  99.9 A.Elbow - B.Elbow 10 58 ?49 38.2         A.Elbow - Wrist                 SNC    Nerve / Sites Rec. Site Peak Lat Ref.  Amp Ref. Segments Distance Peak Diff Ref.    ms ms V V  cm ms ms  L Radial - Anatomical snuff box (Forearm)     Forearm Wrist 2.6 ?2.9 31 ?15 Forearm - Wrist 10    R Radial - Anatomical snuff box (Forearm)     Forearm Wrist 2.6 ?2.9 26 ?15 Forearm - Wrist 10    L Median, Ulnar - Transcarpal comparison     Median Palm Wrist 2.4 ?2.2 40 ?35 Median Palm - Wrist 8  Ulnar Palm Wrist 2.4 ?2.2 21 ?12 Ulnar Palm - Wrist 8          Median Palm - Ulnar Palm  0.1 ?0.4  R Median, Ulnar - Transcarpal comparison     Median Palm Wrist 2.4 ?2.2 64 ?35 Median Palm - Wrist 8       Ulnar Palm Wrist 2.3 ?2.2 15 ?12 Ulnar Palm - Wrist 8          Median Palm - Ulnar Palm  0.2 ?0.4  L Median - Orthodromic (Dig II, Mid palm)     Dig II Wrist 3.8 ?3.4 18 ?10 Dig II - Wrist 13    R Median - Orthodromic (Dig II, Mid palm)     Dig II Wrist 3.4 ?3.4 24 ?10 Dig II - Wrist 13    L Ulnar - Orthodromic, (Dig V, Mid palm)     Dig V Wrist 3.4 ?3.1 9 ?5 Dig V - Wrist 11    R Ulnar - Orthodromic, (Dig V, Mid palm)     Dig V Wrist 3.2 ?3.1 7 ?5 Dig V - Wrist 46                       F  Wave    Nerve F Lat Ref.   ms ms  L Ulnar - ADM 28.0 ?32.0  R Ulnar - ADM 27.9 ?32.0         EMG full       EMG Summary Table    Spontaneous MUAP Recruitment  Muscle IA Fib PSW Fasc Other Amp Dur. Poly Pattern  L. Deltoid Normal None None None _______ Normal Normal Normal Normal  L. Triceps brachii Normal None None None _______ Normal Normal Normal  Normal  L. Biceps brachii Normal None None None _______ Normal Normal Normal Normal  L. Flexor carpi radialis Normal None None None _______ Normal Normal Normal Normal  L. First dorsal interosseous Normal None None None _______ Normal Normal Normal Normal

## 2018-10-30 ENCOUNTER — Encounter: Payer: Self-pay | Admitting: Nurse Practitioner

## 2018-10-30 ENCOUNTER — Ambulatory Visit (INDEPENDENT_AMBULATORY_CARE_PROVIDER_SITE_OTHER): Payer: BC Managed Care – PPO | Admitting: Nurse Practitioner

## 2018-10-30 DIAGNOSIS — N3000 Acute cystitis without hematuria: Secondary | ICD-10-CM

## 2018-10-30 MED ORDER — FLUCONAZOLE 150 MG PO TABS: 150.0000 mg | ORAL_TABLET | Freq: Once | ORAL | 0 refills | Status: AC

## 2018-10-30 MED ORDER — CEPHALEXIN 500 MG PO CAPS: 500.0000 mg | ORAL_CAPSULE | Freq: Two times a day (BID) | ORAL | 0 refills | Status: DC

## 2018-10-30 NOTE — Progress Notes (Signed)
   Virtual Visit via telephone Note Due to COVID-19 pandemic this visit was conducted virtually. This visit type was conducted due to national recommendations for restrictions regarding the COVID-19 Pandemic (e.g. social distancing, sheltering in place) in an effort to limit this patient's exposure and mitigate transmission in our community. All issues noted in this document were discussed and addressed.  A physical exam was not performed with this format.  I connected with Courtney Brooks on 10/30/18 at 2:20 by telephone and verified that I am speaking with the correct person using two identifiers. Courtney Brooks is currently located at home and no one is currently with her during visit. The provider, Mary-Margaret Hassell Done, FNP is located in their office at time of visit.  I discussed the limitations, risks, security and privacy concerns of performing an evaluation and management service by telephone and the availability of in person appointments. I also discussed with the patient that there may be a patient responsible charge related to this service. The patient expressed understanding and agreed to proceed.   History and Present Illness:   Chief Complaint: Urinary Tract Infection   HPI Patient call in for appointment today c/o chills and low grade fever that started yesterday and this morning her urine has a foul odor. Has slight lower adb pressure. Slight urinary frequency.   Review of Systems  Constitutional: Positive for chills and fever.  HENT: Negative.   Respiratory: Negative.   Cardiovascular: Negative.   Genitourinary: Positive for dysuria, frequency and urgency.  Skin: Negative.   Neurological: Negative.   Psychiatric/Behavioral: Negative.   All other systems reviewed and are negative.    Observations/Objective: Alert and oriented- answers all questions appropriately No distress    Assessment and Plan: Courtney Brooks in today with chief complaint of Urinary Tract  Infection   1. Acute cystitis without hematuria Take medication as prescribe Cotton underwear Take shower not bath Cranberry juice, yogurt Force fluids AZO over the counter X2 days RTO prn  - cephALEXin (KEFLEX) 500 MG capsule; Take 1 capsule (500 mg total) by mouth 2 (two) times daily.  Dispense: 10 capsule; Refill: 0 - fluconazole (DIFLUCAN) 150 MG tablet; Take 1 tablet (150 mg total) by mouth once for 1 dose.  Dispense: 1 tablet; Refill: 0   Follow Up Instructions: prn    I discussed the assessment and treatment plan with the patient. The patient was provided an opportunity to ask questions and all were answered. The patient agreed with the plan and demonstrated an understanding of the instructions.   The patient was advised to call back or seek an in-person evaluation if the symptoms worsen or if the condition fails to improve as anticipated.  The above assessment and management plan was discussed with the patient. The patient verbalized understanding of and has agreed to the management plan. Patient is aware to call the clinic if symptoms persist or worsen. Patient is aware when to return to the clinic for a follow-up visit. Patient educated on when it is appropriate to go to the emergency department.   Time call ended:  2:31   I provided 11 minutes of non-face-to-face time during this encounter.    Mary-Margaret Hassell Done, FNP

## 2018-11-10 DIAGNOSIS — Z6829 Body mass index (BMI) 29.0-29.9, adult: Secondary | ICD-10-CM | POA: Diagnosis not present

## 2018-11-10 DIAGNOSIS — T7840XA Allergy, unspecified, initial encounter: Secondary | ICD-10-CM | POA: Diagnosis not present

## 2018-12-05 ENCOUNTER — Encounter: Payer: Self-pay | Admitting: Family Medicine

## 2018-12-05 ENCOUNTER — Ambulatory Visit (INDEPENDENT_AMBULATORY_CARE_PROVIDER_SITE_OTHER): Payer: BC Managed Care – PPO | Admitting: Family Medicine

## 2018-12-05 DIAGNOSIS — R110 Nausea: Secondary | ICD-10-CM | POA: Diagnosis not present

## 2018-12-05 DIAGNOSIS — R519 Headache, unspecified: Secondary | ICD-10-CM | POA: Diagnosis not present

## 2018-12-05 DIAGNOSIS — R05 Cough: Secondary | ICD-10-CM | POA: Diagnosis not present

## 2018-12-05 DIAGNOSIS — Z20828 Contact with and (suspected) exposure to other viral communicable diseases: Secondary | ICD-10-CM | POA: Diagnosis not present

## 2018-12-05 NOTE — Progress Notes (Signed)
    Subjective:    Patient ID: Courtney Brooks, female    DOB: Jul 23, 1970, 48 y.o.   MRN: [id number]   HPI: Courtney Brooks is a 48 y.o. female presenting for headache. She states she was told by our nurse to go to urgent care this morning, so she has already  Been seen.  Claretta Fraise, MD [place]

## 2018-12-07 DIAGNOSIS — R079 Chest pain, unspecified: Secondary | ICD-10-CM | POA: Diagnosis not present

## 2018-12-07 DIAGNOSIS — T7840XA Allergy, unspecified, initial encounter: Secondary | ICD-10-CM | POA: Diagnosis not present

## 2018-12-07 DIAGNOSIS — T783XXA Angioneurotic edema, initial encounter: Secondary | ICD-10-CM | POA: Diagnosis not present

## 2018-12-11 ENCOUNTER — Telehealth: Payer: Self-pay | Admitting: Family Medicine

## 2018-12-11 NOTE — Telephone Encounter (Signed)
Patient had episode of nausea, dizziness and headache last week.  She went to urgent care and blood pressure was elevated when taken.  Since then she has had a headache and felt like her blood pressure may be elevated.  Appointment scheduled for telephone visit with Dr. Warrick Parisian on 12/12/2018 at 9:25 am.

## 2018-12-12 ENCOUNTER — Ambulatory Visit (INDEPENDENT_AMBULATORY_CARE_PROVIDER_SITE_OTHER): Payer: BC Managed Care – PPO | Admitting: Family Medicine

## 2018-12-12 ENCOUNTER — Other Ambulatory Visit: Payer: Self-pay

## 2018-12-12 ENCOUNTER — Encounter: Payer: Self-pay | Admitting: Family Medicine

## 2018-12-12 ENCOUNTER — Ambulatory Visit: Payer: BC Managed Care – PPO

## 2018-12-12 DIAGNOSIS — R030 Elevated blood-pressure reading, without diagnosis of hypertension: Secondary | ICD-10-CM | POA: Diagnosis not present

## 2018-12-12 DIAGNOSIS — R002 Palpitations: Secondary | ICD-10-CM

## 2018-12-12 NOTE — Progress Notes (Signed)
Virtual Visit via telephone Note  I connected with Courtney Brooks on 12/12/18 at 0945 by telephone and verified that I am speaking with the correct person using two identifiers. Courtney Brooks is currently located at home and no other people are currently with her during visit. The provider, Fransisca Kaufmann Briea Mcenery, MD is located in their office at time of visit.  Call ended at [phone]  I discussed the limitations, risks, security and privacy concerns of performing an evaluation and management service by telephone and the availability of in person appointments. I also discussed with the patient that there may be a patient responsible charge related to this service. The patient expressed understanding and agreed to proceed.   History and Present Illness: Patient is calling in for bp and headache and dizziness for 2 days 10 days ago and then went to urgent care 1 week ago and tested for flu and covid and they were negative.  5 days ago she had chest pain and palpitations. Bottom lip was swollen and purple and went to ED. BP was 165/102 in ED and she had a previous allergic reaction 3 weeks ago as well. She has bp elevated over the past week. Her diastolic runs 48-54, systolic is 627-035.  Yesterday she had jaw tightness. Her lip swelling has gone with steroids. She denies chest palpitations.  She does not have the jaw pain but she still began intermittent palpitations and then her blood pressure is up and she has some headaches associated with it.  No diagnosis found.  Outpatient Encounter Medications as of 12/12/2018  Medication Sig  . albuterol (VENTOLIN HFA) 108 (90 Base) MCG/ACT inhaler Inhale 2 puffs into the lungs every 6 (six) hours as needed for wheezing or shortness of breath.  . cephALEXin (KEFLEX) 500 MG capsule Take 1 capsule (500 mg total) by mouth 2 (two) times daily.  . fluticasone (FLONASE) 50 MCG/ACT nasal spray Place 2 sprays into both nostrils daily. (Patient taking differently: Place 2  sprays into both nostrils daily as needed for allergies. )  . omeprazole (PRILOSEC) 20 MG capsule Take 1 capsule (20 mg total) by mouth daily.  . ondansetron (ZOFRAN) 4 MG tablet Take 1 tablet (4 mg total) by mouth every 8 (eight) hours as needed for nausea or vomiting.  . phentermine 37.5 MG capsule Take 37.5 mg by mouth every morning.   No facility-administered encounter medications on file as of 12/12/2018.     Review of Systems  Constitutional: Negative for chills and fever.  HENT: Negative for congestion, ear pain, sinus pressure, sinus pain and sore throat.   Eyes: Negative for redness and visual disturbance.  Respiratory: Positive for chest tightness. Negative for cough, shortness of breath and wheezing.   Cardiovascular: Positive for palpitations. Negative for chest pain and leg swelling.  Gastrointestinal: Negative for abdominal pain.  Genitourinary: Negative for difficulty urinating and dysuria.  Musculoskeletal: Negative for back pain and gait problem.  Skin: Negative for rash.  Neurological: Negative for light-headedness and headaches.  Psychiatric/Behavioral: Negative for agitation and behavioral problems.  All other systems reviewed and are negative.   Observations/Objective: Patient sounds comfortable and in no acute distress  Assessment and Plan: Problem List Items Addressed This Visit    None    Visit Diagnoses    Palpitations    -  Primary   Relevant Orders   Holter monitor - 48 hour   Elevated BP without diagnosis of hypertension       Relevant Orders  Holter monitor - 48 hour       Follow Up Instructions: Follow-up after Holter monitor follow-up on hypertension as well, will have her check it with her triage person as well today.    I discussed the assessment and treatment plan with the patient. The patient was provided an opportunity to ask questions and all were answered. The patient agreed with the plan and demonstrated an understanding of the  instructions.   The patient was advised to call back or seek an in-person evaluation if the symptoms worsen or if the condition fails to improve as anticipated.  The above assessment and management plan was discussed with the patient. The patient verbalized understanding of and has agreed to the management plan. Patient is aware to call the clinic if symptoms persist or worsen. Patient is aware when to return to the clinic for a follow-up visit. Patient educated on when it is appropriate to go to the emergency department.    I provided 13 minutes of non-face-to-face time during this encounter.    Nils Pyle, MD

## 2018-12-12 NOTE — Progress Notes (Signed)
Patient here today to apply 24 hour holter monitor.  Patient to return tomorrow for new monitor to wear another 24 hours per Dr. Warrick Parisian.  Patient's blood pressure today is 147/100,pulse 113.  Blood pressure was rechecked after resting and was 135/97, pulse 114.

## 2018-12-13 ENCOUNTER — Ambulatory Visit (INDEPENDENT_AMBULATORY_CARE_PROVIDER_SITE_OTHER): Payer: BC Managed Care – PPO

## 2018-12-13 DIAGNOSIS — R002 Palpitations: Secondary | ICD-10-CM | POA: Diagnosis not present

## 2018-12-13 NOTE — Progress Notes (Signed)
Patient here today to have 24 hour Holter monitor removed and have another one reapplied for another 24 hours.

## 2018-12-14 DIAGNOSIS — R002 Palpitations: Secondary | ICD-10-CM | POA: Diagnosis not present

## 2019-01-01 DIAGNOSIS — Z20828 Contact with and (suspected) exposure to other viral communicable diseases: Secondary | ICD-10-CM | POA: Diagnosis not present

## 2019-01-02 DIAGNOSIS — Z1231 Encounter for screening mammogram for malignant neoplasm of breast: Secondary | ICD-10-CM | POA: Diagnosis not present

## 2019-01-13 DIAGNOSIS — T7840XA Allergy, unspecified, initial encounter: Secondary | ICD-10-CM | POA: Diagnosis not present

## 2019-01-13 DIAGNOSIS — R220 Localized swelling, mass and lump, head: Secondary | ICD-10-CM | POA: Diagnosis not present

## 2019-01-13 DIAGNOSIS — R609 Edema, unspecified: Secondary | ICD-10-CM | POA: Diagnosis not present

## 2019-01-15 DIAGNOSIS — K219 Gastro-esophageal reflux disease without esophagitis: Secondary | ICD-10-CM | POA: Diagnosis not present

## 2019-01-15 DIAGNOSIS — T7840XA Allergy, unspecified, initial encounter: Secondary | ICD-10-CM | POA: Diagnosis not present

## 2019-01-30 ENCOUNTER — Other Ambulatory Visit: Payer: Self-pay

## 2019-01-30 ENCOUNTER — Ambulatory Visit: Payer: BC Managed Care – PPO | Attending: Internal Medicine

## 2019-01-30 DIAGNOSIS — Z20822 Contact with and (suspected) exposure to covid-19: Secondary | ICD-10-CM | POA: Diagnosis not present

## 2019-01-31 LAB — NOVEL CORONAVIRUS, NAA: SARS-CoV-2, NAA: NOT DETECTED

## 2019-02-05 ENCOUNTER — Other Ambulatory Visit (HOSPITAL_COMMUNITY)
Admission: RE | Admit: 2019-02-05 | Discharge: 2019-02-05 | Disposition: A | Discharge status: Home / Self Care | Payer: BC Managed Care – PPO | Source: Ambulatory Visit | Attending: Obstetrics and Gynecology | Admitting: Obstetrics and Gynecology

## 2019-02-05 ENCOUNTER — Other Ambulatory Visit: Payer: Self-pay

## 2019-02-05 ENCOUNTER — Encounter: Payer: Self-pay | Admitting: Obstetrics and Gynecology

## 2019-02-05 ENCOUNTER — Ambulatory Visit (INDEPENDENT_AMBULATORY_CARE_PROVIDER_SITE_OTHER): Payer: BC Managed Care – PPO | Admitting: Obstetrics and Gynecology

## 2019-02-05 VITALS — BP 154/100 | Ht 70.0 in | Wt 224.0 lb

## 2019-02-05 DIAGNOSIS — N951 Menopausal and female climacteric states: Secondary | ICD-10-CM | POA: Diagnosis not present

## 2019-02-05 DIAGNOSIS — Z6832 Body mass index (BMI) 32.0-32.9, adult: Secondary | ICD-10-CM

## 2019-02-05 DIAGNOSIS — Z01419 Encounter for gynecological examination (general) (routine) without abnormal findings: Secondary | ICD-10-CM | POA: Diagnosis not present

## 2019-02-05 DIAGNOSIS — Z124 Encounter for screening for malignant neoplasm of cervix: Secondary | ICD-10-CM | POA: Diagnosis not present

## 2019-02-05 DIAGNOSIS — E669 Obesity, unspecified: Secondary | ICD-10-CM

## 2019-02-05 DIAGNOSIS — Z131 Encounter for screening for diabetes mellitus: Secondary | ICD-10-CM

## 2019-02-05 DIAGNOSIS — Z13220 Encounter for screening for lipoid disorders: Secondary | ICD-10-CM | POA: Diagnosis not present

## 2019-02-05 DIAGNOSIS — Z1239 Encounter for other screening for malignant neoplasm of breast: Secondary | ICD-10-CM

## 2019-02-05 DIAGNOSIS — Z1329 Encounter for screening for other suspected endocrine disorder: Secondary | ICD-10-CM

## 2019-02-05 NOTE — Patient Instructions (Signed)
[facility] [address] [place] [address]  [facility] [place]  [address] [place] [address]  Phone: [phone]  

## 2019-02-05 NOTE — Progress Notes (Signed)
Courtney Brooks  PCP: Dettinger, Fransisca Kaufmann, MD  Chief Complaint:  Chief Complaint  Patient presents with  . Gynecologic Brooks    History of Present Illness:Patient is a 49 y.o. Courtney Brooks presents for annual Brooks. The patient has no complaints today.   LMP: No LMP recorded. Patient has had an ablation. Absent secondary to prior ablation.  The patient is sexually active. She denies dyspareunia.  The patient does perform self breast exams.  There is no notable family history of breast or ovarian cancer in her family.  The patient wears seatbelts: yes.   The patient has regular exercise: not asked.    The patient denies current symptoms of depression.     Review of Systems: Review of Systems  Constitutional: Negative for chills and fever.  HENT: Negative for congestion.   Respiratory: Negative for cough and shortness of breath.   Cardiovascular: Negative for chest pain and palpitations.  Gastrointestinal: Negative for abdominal pain, constipation, diarrhea, heartburn, nausea and vomiting.  Genitourinary: Negative for dysuria, frequency and urgency.  Skin: Negative for itching and rash.  Neurological: Negative for dizziness and headaches.  Endo/Heme/Allergies: Negative for polydipsia.  Psychiatric/Behavioral: Negative for depression.    Past Medical History:  Past Medical History:  Diagnosis Date  . Endometriosis   . Family history of breast cancer    BRCA testing letter sent 2017  . GERD (gastroesophageal reflux disease)   . Hx: UTI (urinary tract infection)     Past Surgical History:  Past Surgical History:  Procedure Laterality Date  . APPENDECTOMY    . BACK SURGERY  10/2014  . ENDOMETRIAL ABLATION  03/25/2011  . RIGHT OOPHORECTOMY  2006  . SPINAL CORD STIMULATOR INSERTION N/A 02/09/2018   Procedure: LUMBAR SPINAL CORD STIMULATOR INSERTION;  Surgeon: Melina Schools, MD;  Location: [facility];  Service: Orthopedics;  Laterality: N/A;  . [place]     Gynecologic History:  No LMP recorded. Patient has had an ablation. Last Pap: Results were: 06/12/2015 NIL and HR HPV negative  Last mammogram:01/02/2019 Results were: BI-RAD II  Obstetric History: [id number]  Family History:  Family History  Problem Relation Age of Onset  . Hypertension Mother   . Kidney disease Mother   . Diabetes Mother   . Heart disease Father   . Breast cancer Paternal Grandmother 7  . Lung cancer Maternal Grandmother   . Hypertension Maternal Grandmother   . Colon cancer Neg Hx     Social History:  Social History   Socioeconomic History  . Marital status: Married    Spouse name: Not on file  . Number of children: 2  . Years of education: Not on file  . Highest education level: Not on file  Occupational History  . Occupation: Educational psychologist  Tobacco Use  . Smoking status: Never Smoker  . Smokeless tobacco: Never Used  Substance and Sexual Activity  . Alcohol use: Yes    Alcohol/week: 4.0 standard drinks    Types: 4 Standard drinks or equivalent per week    Comment: occ  . Drug use: No  . Sexual activity: Yes    Birth control/protection: None  Other Topics Concern  . Not on file  Social History Narrative   Daily caffeine    Social Determinants of Health   Financial Resource Strain:   . Difficulty of Paying Living Expenses: Not on file  Food Insecurity:   . Worried About Charity fundraiser in the Last Year: Not  on file  . Ran Out of Food in the Last Year: Not on file  Transportation Needs:   . Lack of Transportation (Medical): Not on file  . Lack of Transportation (Non-Medical): Not on file  Physical Activity:   . Days of Exercise per Week: Not on file  . Minutes of Exercise per Session: Not on file  Stress:   . Feeling of Stress : Not on file  Social Connections:   . Frequency of Communication with Friends and Family: Not on file  . Frequency of Social Gatherings with Friends and Family: Not on file  . Attends Religious  Services: Not on file  . Active Member of Clubs or Organizations: Not on file  . Attends Archivist Meetings: Not on file  . Marital Status: Not on file  Intimate Partner Violence:   . Fear of Current or Ex-Partner: Not on file  . Emotionally Abused: Not on file  . Physically Abused: Not on file  . Sexually Abused: Not on file    Allergies:  Allergies  Allergen Reactions  . Percocet [Oxycodone-Acetaminophen] Itching    Nose itches    Medications: Prior to Admission medications   Medication Sig Start Date End Date Taking? Authorizing Provider  albuterol (VENTOLIN HFA) 108 (90 Base) MCG/ACT inhaler Inhale 2 puffs into the lungs every 6 (six) hours as needed for wheezing or shortness of breath. 06/12/18  Yes Rakes, Connye Burkitt, FNP  fluticasone (FLONASE) 50 MCG/ACT nasal spray Place 2 sprays into both nostrils daily. Patient taking differently: Place 2 sprays into both nostrils daily as needed for allergies.  10/09/15  Yes Hawks, Christy A, FNP  lisinopril (ZESTRIL) 10 MG tablet Take by mouth. 01/22/19  Yes [provider]    Physical Brooks Vitals: Blood pressure (!) 154/100, height 5' 10"  (1.778 m), weight 224 lb (101.6 kg). Body mass index is 32.14 kg/m.  General: NAD HEENT: normocephalic, anicteric Thyroid: no enlargement, no palpable nodules Pulmonary: No increased work of breathing, CTAB Cardiovascular: RRR, distal pulses 2+ Breast: Breast symmetrical, no tenderness, no palpable nodules or masses, no skin or nipple retraction present, no nipple discharge.  No axillary or supraclavicular lymphadenopathy. Abdomen: NABS, soft, non-tender, non-distended.  Umbilicus without lesions.  No hepatomegaly, splenomegaly or masses palpable. No evidence of hernia  Genitourinary:  External: Normal external female genitalia.  Normal urethral meatus, normal Bartholin's and Skene's glands.    Vagina: Normal vaginal mucosa, no evidence of prolapse.    Cervix: Grossly normal in  appearance, no bleeding  Uterus: Non-enlarged, mobile, normal contour.  No CMT  Adnexa: ovaries non-enlarged, no adnexal masses  Rectal: deferred  Lymphatic: no evidence of inguinal lymphadenopathy Extremities: no edema, erythema, or tenderness Neurologic: Grossly intact Psychiatric: mood appropriate, affect full  Female chaperone present for pelvic and breast  portions of the physical Brooks     Assessment: 49 y.o. [id number] routine annual Brooks  Plan: Problem List Items Addressed This Visit    None    Visit Diagnoses    Encounter for gynecological examination without abnormal finding    -  Primary   Relevant Orders   Estradiol   Follicle stimulating hormone   CMP14+LP+TP+TSH+CBC/Plt   Hemoglobin A1c   Screening for malignant neoplasm of cervix       Relevant Orders   Cytology - PAP   Breast screening       Relevant Orders   MM 3D SCREEN BREAST BILATERAL   Thyroid disorder screening  Relevant Orders   CMP14+LP+TP+TSH+CBC/Plt   Screening for diabetes mellitus       Relevant Orders   CMP14+LP+TP+TSH+CBC/Plt   Hemoglobin A1c   Lipid screening       Relevant Orders   CMP14+LP+TP+TSH+CBC/Plt   Perimenopausal vasomotor symptoms       Relevant Orders   Estradiol   Follicle stimulating hormone   CMP14+LP+TP+TSH+CBC/Plt   Class 1 obesity with serious comorbidity and body mass index (BMI) of 32.0 to 32.9 in adult, unspecified obesity type       Relevant Orders   CMP14+LP+TP+TSH+CBC/Plt   Hemoglobin A1c      1) Mammogram - recommend yearly screening mammogram.  Mammogram Is up to date - [facility]  2) STI screening  was notoffered and therefore not obtained  3) ASCCP guidelines and rational discussed.  Patient opts for every 3 years screening interval  4) Osteoporosis  - per USPTF routine screening DEXA at age 75  5) Routine healthcare maintenance including cholesterol, diabetes screening discussed Ordered today as we were drawing labs given worsening hot flashes  6)  Colonoscopy 08/17/2011  7) Return in about 1 year (around 02/05/2020) for annual.    Malachy Mood, MD [place]etta Pigeon, [place] 02/05/2019, 4:07 PM

## 2019-02-06 LAB — CMP14+LP+TP+TSH+CBC/PLT
ALT: 25 IU/L (ref 0–32)
AST: 23 IU/L (ref 0–40)
Albumin/Globulin Ratio: 1.8 (ref 1.2–2.2)
Albumin: 4.5 g/dL (ref 3.8–4.8)
Alkaline Phosphatase: 92 IU/L (ref 39–117)
BUN/Creatinine Ratio: 16 (ref 9–23)
BUN: 13 mg/dL (ref 6–24)
Bilirubin Total: 0.2 mg/dL (ref 0.0–1.2)
CO2: 24 mmol/L (ref 20–29)
Calcium: 9.7 mg/dL (ref 8.7–10.2)
Chloride: 99 mmol/L (ref 96–106)
Cholesterol, Total: 208 mg/dL — ABNORMAL HIGH (ref 100–199)
Creatinine, Ser: 0.8 mg/dL (ref 0.57–1.00)
Free Thyroxine Index: 1.4 (ref 1.2–4.9)
GFR calc Af Amer: 99 mL/min/{1.73_m2} (ref >59)
GFR calc non Af Amer: 86 mL/min/{1.73_m2} (ref >59)
Globulin, Total: 2.5 g/dL (ref 1.5–4.5)
Glucose: 79 mg/dL (ref 65–99)
HDL: 77 mg/dL (ref >39)
Hematocrit: 36.8 % (ref 34.0–46.6)
Hemoglobin: 12.9 g/dL (ref 11.1–15.9)
LDL Chol Calc (NIH): 103 mg/dL — ABNORMAL HIGH (ref 0–99)
LDL/HDL Ratio: 1.3 ratio (ref 0.0–3.2)
MCH: 31.5 pg (ref 26.6–33.0)
MCHC: 35.1 g/dL (ref 31.5–35.7)
MCV: 90 fL (ref 79–97)
Platelets: 326 10*3/uL (ref 150–450)
Potassium: 3.8 mmol/L (ref 3.5–5.2)
RBC: 4.09 x10E6/uL (ref 3.77–5.28)
RDW: 12.8 % (ref 11.7–15.4)
Sodium: 138 mmol/L (ref 134–144)
T3 Uptake Ratio: 26 % (ref 24–39)
T4, Total: 5.5 ug/dL (ref 4.5–12.0)
TSH: 2.46 u[IU]/mL (ref 0.450–4.500)
Total Protein: 7 g/dL (ref 6.0–8.5)
Triglycerides: 164 mg/dL — ABNORMAL HIGH (ref 0–149)
VLDL Cholesterol Cal: 28 mg/dL (ref 5–40)
WBC: 7.2 10*3/uL (ref 3.4–10.8)

## 2019-02-06 LAB — FOLLICLE STIMULATING HORMONE: FSH: 8.8 m[IU]/mL

## 2019-02-06 LAB — ESTRADIOL: Estradiol: 141 pg/mL

## 2019-02-06 LAB — HEMOGLOBIN A1C
Est. average glucose Bld gHb Est-mCnc: 105 mg/dL
Hgb A1c MFr Bld: 5.3 % (ref 4.8–5.6)

## 2019-02-07 LAB — CYTOLOGY - PAP
Comment: NEGATIVE
Diagnosis: NEGATIVE
High risk HPV: NEGATIVE

## 2019-02-13 NOTE — Progress Notes (Signed)
Dettinger, Fransisca Kaufmann, MD   Chief Complaint  Patient presents with  . Breast exam    spot on LB, red, does not feel like a lump since last thurs/fri    HPI:      Courtney Brooks is a 49 y.o. [id number] who LMP was No LMP recorded. Patient has had an ablation., presents today for LT breast skin lesion since last wk. Started as a pinpoint spot and has gotten bigger since. Area is red. No pain/itch. No trauma, nipple d/c, mass. No meds to treat. Does SBE. Hx of LT breast bx in past that was neg. Last mammo 01/02/19 at [facility]. FH breast cancer in her PGM age 36.   Patient Active Problem List   Diagnosis Date Noted  . Chronic pain 02/09/2018    Past Surgical History:  Procedure Laterality Date  . APPENDECTOMY    . BACK SURGERY  10/2014  . ENDOMETRIAL ABLATION  03/25/2011  . RIGHT OOPHORECTOMY  2006  . SPINAL CORD STIMULATOR INSERTION N/A 02/09/2018   Procedure: LUMBAR SPINAL CORD STIMULATOR INSERTION;  Surgeon: Melina Schools, MD;  Location: [facility];  Service: Orthopedics;  Laterality: N/A;  . TUBAL LIGATION  1993    Family History  Problem Relation Age of Onset  . Hypertension Mother   . Kidney disease Mother   . Diabetes Mother   . Heart disease Father   . Breast cancer Paternal Grandmother 40  . Lung cancer Maternal Grandmother   . Hypertension Maternal Grandmother   . Colon cancer Neg Hx     Social History   Socioeconomic History  . Marital status: Married    Spouse name: Not on file  . Number of children: 2  . Years of education: Not on file  . Highest education level: Not on file  Occupational History  . Occupation: Educational psychologist  Tobacco Use  . Smoking status: Never Smoker  . Smokeless tobacco: Never Used  Substance and Sexual Activity  . Alcohol use: Yes    Alcohol/week: 4.0 standard drinks    Types: 4 Standard drinks or equivalent per week    Comment: occ  . Drug use: No  . Sexual activity: Yes    Birth control/protection: None  Other Topics Concern    . Not on file  Social History Narrative   Daily caffeine    Social Determinants of Health   Financial Resource Strain:   . Difficulty of Paying Living Expenses: Not on file  Food Insecurity:   . Worried About Charity fundraiser in the Last Year: Not on file  . Ran Out of Food in the Last Year: Not on file  Transportation Needs:   . Lack of Transportation (Medical): Not on file  . Lack of Transportation (Non-Medical): Not on file  Physical Activity:   . Days of Exercise per Week: Not on file  . Minutes of Exercise per Session: Not on file  Stress:   . Feeling of Stress : Not on file  Social Connections:   . Frequency of Communication with Friends and Family: Not on file  . Frequency of Social Gatherings with Friends and Family: Not on file  . Attends Religious Services: Not on file  . Active Member of Clubs or Organizations: Not on file  . Attends Archivist Meetings: Not on file  . Marital Status: Not on file  Intimate Partner Violence:   . Fear of Current or Ex-Partner: Not on file  . Emotionally Abused:  Not on file  . Physically Abused: Not on file  . Sexually Abused: Not on file    Outpatient Medications Prior to Visit  Medication Sig Dispense Refill  . EPINEPHrine 0.3 mg/0.3 mL IJ SOAJ injection Inject into the muscle.    . famotidine (PEPCID) 40 MG tablet Take by mouth.    . fluticasone (FLONASE) 50 MCG/ACT nasal spray Place 2 sprays into both nostrils daily. (Patient taking differently: Place 2 sprays into both nostrils daily as needed for allergies. ) 16 g 6  . levocetirizine (XYZAL) 5 MG tablet Take by mouth.    Marland Kitchen lisinopril (ZESTRIL) 10 MG tablet Take by mouth.    Marland Kitchen albuterol (VENTOLIN HFA) 108 (90 Base) MCG/ACT inhaler Inhale 2 puffs into the lungs every 6 (six) hours as needed for wheezing or shortness of breath. 1 Inhaler 2   No facility-administered medications prior to visit.      ROS:  Review of Systems  Constitutional: Negative for fever.   Gastrointestinal: Negative for blood in stool, constipation, diarrhea, nausea and vomiting.  Genitourinary: Negative for dyspareunia, dysuria, flank pain, frequency, hematuria, urgency, vaginal bleeding, vaginal discharge and vaginal pain.  Musculoskeletal: Negative for back pain.  Skin: Negative for rash.   BREAST: redness   OBJECTIVE:   Vitals:  BP (!) 142/90   Ht 5\' 10"  (1.778 m)   Wt 221 lb (100.2 kg)   BMI 31.71 kg/m   Physical Exam Vitals reviewed.  Pulmonary:     Effort: Pulmonary effort is normal.  Chest:     Breasts: Breasts are symmetrical.        Right: No inverted nipple, mass, nipple discharge, skin change or tenderness.        Left: Skin change present. No inverted nipple, mass, nipple discharge or tenderness.    Musculoskeletal:        General: Normal range of motion.     Cervical back: Normal range of motion.  Skin:    General: Skin is warm and dry.  Neurological:     General: No focal deficit present.     Mental Status: She is alert and oriented to person, place, and time.     Cranial Nerves: No cranial nerve deficit.  Psychiatric:        Mood and Affect: Mood normal.        Behavior: Behavior normal.        Thought Content: Thought content normal.        Judgment: Judgment normal.     Assessment/Plan: Lesion of skin of breast - Plan: clotrimazole-betamethasone (LOTRISONE) cream; Question fungal. Rx lotrisone crm for 2-4 wks, if sx improving. If no sx change in 2 wks, pt to f/u via phone for derm ref for further eval/possible bx.    Meds ordered this encounter  Medications  . clotrimazole-betamethasone (LOTRISONE) cream    Sig: Apply externally BID for 2-4 wks    Dispense:  15 g    Refill:  0    Order Specific Question:   Supervising Provider    Answer:   Nadara Mustard      Return if symptoms worsen or fail to improve.  Ozell Ferrera B. Curby Carswell, PA-C 02/14/2019 11:46 AM

## 2019-02-13 NOTE — Telephone Encounter (Signed)
Patient is schedule for 02/13/19 with ABC

## 2019-02-14 ENCOUNTER — Encounter: Payer: Self-pay | Admitting: Obstetrics and Gynecology

## 2019-02-14 ENCOUNTER — Ambulatory Visit (INDEPENDENT_AMBULATORY_CARE_PROVIDER_SITE_OTHER): Payer: BC Managed Care – PPO | Admitting: Obstetrics and Gynecology

## 2019-02-14 ENCOUNTER — Other Ambulatory Visit: Payer: Self-pay

## 2019-02-14 VITALS — BP 142/90 | Ht 70.0 in | Wt 221.0 lb

## 2019-02-14 DIAGNOSIS — N6489 Other specified disorders of breast: Secondary | ICD-10-CM | POA: Diagnosis not present

## 2019-02-14 DIAGNOSIS — N649 Disorder of breast, unspecified: Secondary | ICD-10-CM

## 2019-02-14 DIAGNOSIS — L988 Other specified disorders of the skin and subcutaneous tissue: Secondary | ICD-10-CM | POA: Diagnosis not present

## 2019-02-14 MED ORDER — CLOTRIMAZOLE-BETAMETHASONE 1-0.05 % EX CREA: TOPICAL_CREAM | CUTANEOUS | 0 refills | Status: DC

## 2019-02-14 NOTE — Patient Instructions (Signed)
I value your feedback and entrusting us with your care. If you get a [facility] patient survey, I would appreciate you taking the time to let us know about your experience today. Thank you!  As of December 14, 2018, your lab results will be released to your MyChart immediately, before I even have a chance to see them. Please give me time to review them and contact you if there are any abnormalities. Thank you for your patience.  

## 2019-02-16 DIAGNOSIS — M79641 Pain in right hand: Secondary | ICD-10-CM | POA: Diagnosis not present

## 2019-02-16 DIAGNOSIS — M79642 Pain in left hand: Secondary | ICD-10-CM | POA: Diagnosis not present

## 2019-02-16 DIAGNOSIS — G44229 Chronic tension-type headache, not intractable: Secondary | ICD-10-CM | POA: Diagnosis not present

## 2019-02-16 DIAGNOSIS — G5603 Carpal tunnel syndrome, bilateral upper limbs: Secondary | ICD-10-CM | POA: Diagnosis not present

## 2019-02-28 DIAGNOSIS — H539 Unspecified visual disturbance: Secondary | ICD-10-CM | POA: Diagnosis not present

## 2019-02-28 DIAGNOSIS — R519 Headache, unspecified: Secondary | ICD-10-CM | POA: Diagnosis not present

## 2019-02-28 DIAGNOSIS — G8929 Other chronic pain: Secondary | ICD-10-CM | POA: Diagnosis not present

## 2019-02-28 DIAGNOSIS — R1310 Dysphagia, unspecified: Secondary | ICD-10-CM | POA: Diagnosis not present

## 2019-03-06 DIAGNOSIS — G5603 Carpal tunnel syndrome, bilateral upper limbs: Secondary | ICD-10-CM | POA: Diagnosis not present

## 2019-03-06 DIAGNOSIS — G5602 Carpal tunnel syndrome, left upper limb: Secondary | ICD-10-CM | POA: Diagnosis not present

## 2019-03-06 DIAGNOSIS — G5601 Carpal tunnel syndrome, right upper limb: Secondary | ICD-10-CM | POA: Diagnosis not present

## 2019-03-15 ENCOUNTER — Other Ambulatory Visit: Payer: Self-pay

## 2019-03-15 ENCOUNTER — Ambulatory Visit: Payer: BC Managed Care – PPO | Attending: Internal Medicine

## 2019-03-15 DIAGNOSIS — Z20822 Contact with and (suspected) exposure to covid-19: Secondary | ICD-10-CM | POA: Diagnosis not present

## 2019-03-15 DIAGNOSIS — Z20828 Contact with and (suspected) exposure to other viral communicable diseases: Secondary | ICD-10-CM | POA: Diagnosis not present

## 2019-03-16 LAB — NOVEL CORONAVIRUS, NAA: SARS-CoV-2, NAA: NOT DETECTED

## 2019-03-17 DIAGNOSIS — R58 Hemorrhage, not elsewhere classified: Secondary | ICD-10-CM | POA: Diagnosis not present

## 2019-03-17 DIAGNOSIS — Z79899 Other long term (current) drug therapy: Secondary | ICD-10-CM | POA: Diagnosis not present

## 2019-03-17 DIAGNOSIS — S0181XA Laceration without foreign body of other part of head, initial encounter: Secondary | ICD-10-CM | POA: Diagnosis not present

## 2019-03-17 DIAGNOSIS — S199XXA Unspecified injury of neck, initial encounter: Secondary | ICD-10-CM | POA: Diagnosis not present

## 2019-03-17 DIAGNOSIS — R450 Nervousness: Secondary | ICD-10-CM | POA: Diagnosis not present

## 2019-03-17 DIAGNOSIS — Z885 Allergy status to narcotic agent status: Secondary | ICD-10-CM | POA: Diagnosis not present

## 2019-03-17 DIAGNOSIS — R000 Tachycardia, unspecified: Secondary | ICD-10-CM | POA: Diagnosis not present

## 2019-03-17 DIAGNOSIS — W19XXXA Unspecified fall, initial encounter: Secondary | ICD-10-CM | POA: Diagnosis not present

## 2019-03-17 DIAGNOSIS — S0990XA Unspecified injury of head, initial encounter: Secondary | ICD-10-CM | POA: Diagnosis not present

## 2019-03-17 DIAGNOSIS — I10 Essential (primary) hypertension: Secondary | ICD-10-CM | POA: Diagnosis not present

## 2019-03-23 DIAGNOSIS — S0181XA Laceration without foreign body of other part of head, initial encounter: Secondary | ICD-10-CM | POA: Diagnosis not present

## 2019-03-23 DIAGNOSIS — L0291 Cutaneous abscess, unspecified: Secondary | ICD-10-CM | POA: Diagnosis not present

## 2019-04-18 DIAGNOSIS — G4719 Other hypersomnia: Secondary | ICD-10-CM | POA: Diagnosis not present

## 2019-04-18 DIAGNOSIS — G43709 Chronic migraine without aura, not intractable, without status migrainosus: Secondary | ICD-10-CM | POA: Diagnosis not present

## 2019-04-18 DIAGNOSIS — Z8249 Family history of ischemic heart disease and other diseases of the circulatory system: Secondary | ICD-10-CM | POA: Diagnosis not present

## 2019-04-23 DIAGNOSIS — G5601 Carpal tunnel syndrome, right upper limb: Secondary | ICD-10-CM | POA: Diagnosis not present

## 2019-05-04 DIAGNOSIS — G5601 Carpal tunnel syndrome, right upper limb: Secondary | ICD-10-CM | POA: Diagnosis not present

## 2019-05-14 DIAGNOSIS — Z8249 Family history of ischemic heart disease and other diseases of the circulatory system: Secondary | ICD-10-CM | POA: Diagnosis not present

## 2019-05-14 DIAGNOSIS — R519 Headache, unspecified: Secondary | ICD-10-CM | POA: Diagnosis not present

## 2019-05-24 DIAGNOSIS — J0100 Acute maxillary sinusitis, unspecified: Secondary | ICD-10-CM | POA: Diagnosis not present

## 2019-06-05 DIAGNOSIS — M79641 Pain in right hand: Secondary | ICD-10-CM | POA: Diagnosis not present

## 2019-06-08 DIAGNOSIS — M25531 Pain in right wrist: Secondary | ICD-10-CM | POA: Diagnosis not present

## 2019-06-09 IMAGING — XA Imaging study
3 series · 3 of 3 positions shown · non-contrast
Comparison: Lumbar myelogram and postmyelogram CT 12/24/2016.

CLINICAL DATA: Post myelogram headache.

EXAM:
LUMBAR EPIDUROGRAM AND BLOOD PATCH
FLUOROSCOPY TIME:  26 seconds corresponding to a Dose Area Product
of 60.84 ?Gy*m2
TECHNIQUE: The skin entry site from previous lumbar puncture was localized L1
and L2. An appropriate skin entry site was determined. Operator
donned sterile gloves and mask. Skin site was marked, prepped with
Betadine, and draped in usual sterile fashion, and infiltrated
locally with 1% lidocaine.

[Series 1: ortho standard · 1 of 1 slices shown (1 of 3)]
[im 1/1]
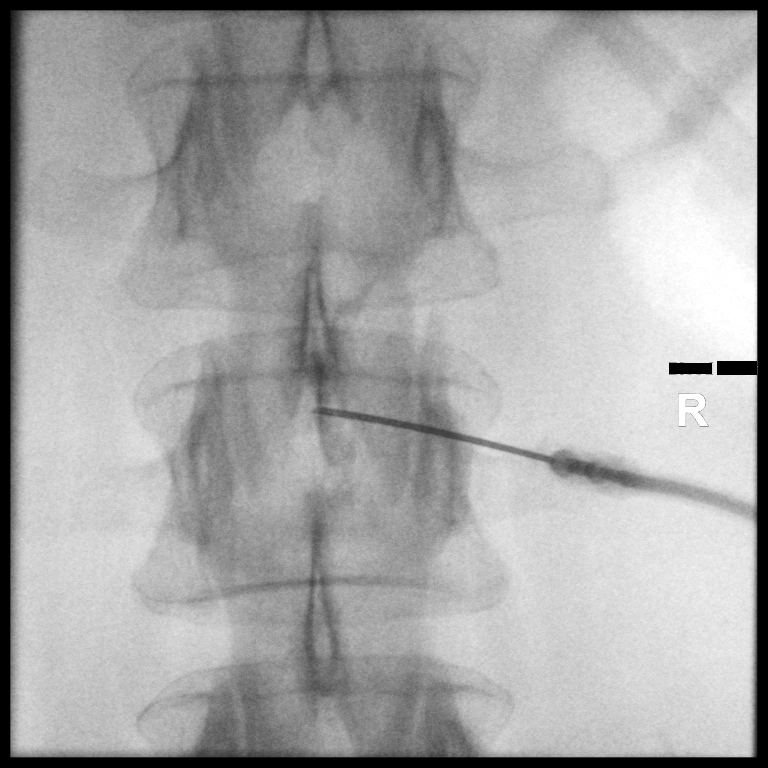

[Series 2: ortho standard · 1 of 1 slices shown (2 of 3)]
[im 1/1]
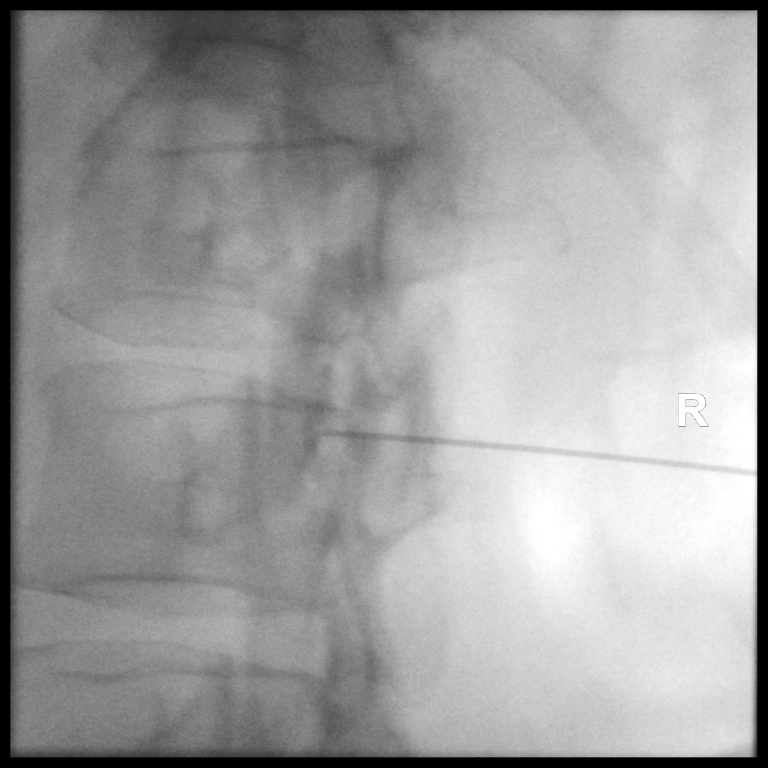

[Series 3: ortho standard · 1 of 1 slices shown (3 of 3)]
[im 1/1]
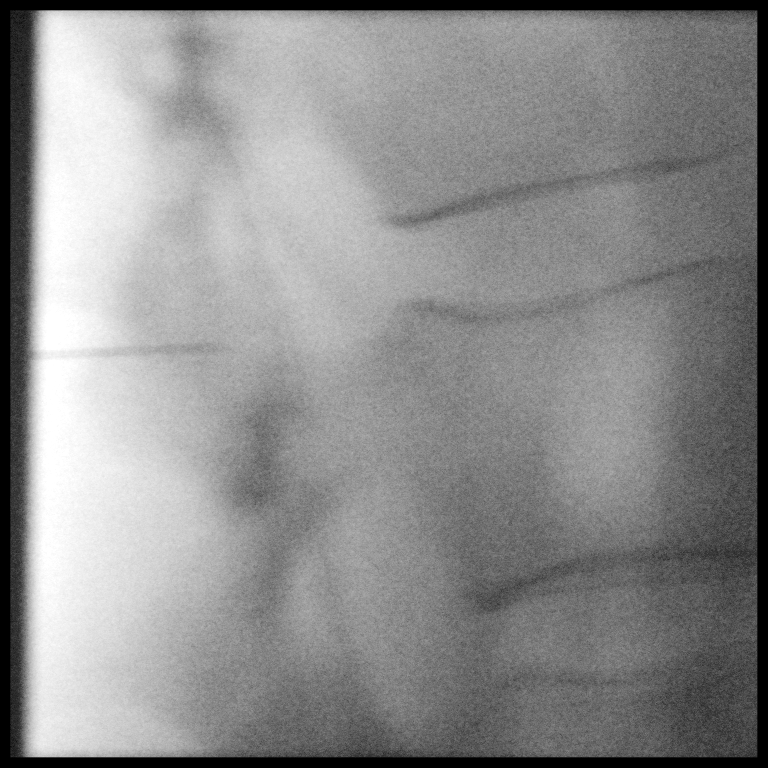

[3 of 3 positions shown; findings below may reference images not displayed]

The 20 gauge Crawford needle was advanced into the lumbar posterior
epidural space near the midline using a right interlaminar approach
at L1-J7using loss of resistance technique. Diagnostic injection of
2mL Isovue-M 200 demonstrates good epidural spread above and below
the needle tip and crossing the midline. No intravascular or
subarachnoid component.

Reduced volume of blood was used because of the proximity of the
lumbar puncture to the conus medullaris. A total of 12.0 ml of
autologous blood were then administered as lumbar epidural blood
patch. No immediate complication.

Patient was comfortable time of discharge.
IMPRESSION: 1. Technically successful lumbar epidural blood patch under
fluoroscopy. The patient was counseled on the importance of laying
flat for an additional 24 hours and maintaining good p.o. fluid
intake.

## 2019-06-12 DIAGNOSIS — M25531 Pain in right wrist: Secondary | ICD-10-CM | POA: Diagnosis not present

## 2019-06-15 DIAGNOSIS — M25531 Pain in right wrist: Secondary | ICD-10-CM | POA: Diagnosis not present

## 2019-06-18 DIAGNOSIS — Z0000 Encounter for general adult medical examination without abnormal findings: Secondary | ICD-10-CM | POA: Diagnosis not present

## 2019-06-18 DIAGNOSIS — Z13220 Encounter for screening for lipoid disorders: Secondary | ICD-10-CM | POA: Diagnosis not present

## 2019-06-18 DIAGNOSIS — I10 Essential (primary) hypertension: Secondary | ICD-10-CM | POA: Diagnosis not present

## 2019-06-18 DIAGNOSIS — K219 Gastro-esophageal reflux disease without esophagitis: Secondary | ICD-10-CM | POA: Diagnosis not present

## 2019-06-19 DIAGNOSIS — M25531 Pain in right wrist: Secondary | ICD-10-CM | POA: Diagnosis not present

## 2019-06-22 DIAGNOSIS — M25531 Pain in right wrist: Secondary | ICD-10-CM | POA: Diagnosis not present

## 2019-06-26 DIAGNOSIS — M79641 Pain in right hand: Secondary | ICD-10-CM | POA: Diagnosis not present

## 2019-06-29 DIAGNOSIS — M79641 Pain in right hand: Secondary | ICD-10-CM | POA: Diagnosis not present

## 2019-07-20 DIAGNOSIS — I10 Essential (primary) hypertension: Secondary | ICD-10-CM | POA: Diagnosis not present

## 2019-07-20 DIAGNOSIS — F988 Other specified behavioral and emotional disorders with onset usually occurring in childhood and adolescence: Secondary | ICD-10-CM | POA: Diagnosis not present

## 2019-07-23 DIAGNOSIS — G43709 Chronic migraine without aura, not intractable, without status migrainosus: Secondary | ICD-10-CM | POA: Diagnosis not present

## 2019-07-23 DIAGNOSIS — I10 Essential (primary) hypertension: Secondary | ICD-10-CM | POA: Diagnosis not present

## 2019-07-23 DIAGNOSIS — Z8249 Family history of ischemic heart disease and other diseases of the circulatory system: Secondary | ICD-10-CM | POA: Diagnosis not present

## 2019-08-13 DIAGNOSIS — R0981 Nasal congestion: Secondary | ICD-10-CM | POA: Diagnosis not present

## 2019-08-13 DIAGNOSIS — J0100 Acute maxillary sinusitis, unspecified: Secondary | ICD-10-CM | POA: Diagnosis not present

## 2019-08-13 DIAGNOSIS — R05 Cough: Secondary | ICD-10-CM | POA: Diagnosis not present

## 2019-08-13 DIAGNOSIS — H10023 Other mucopurulent conjunctivitis, bilateral: Secondary | ICD-10-CM | POA: Diagnosis not present

## 2019-08-24 DIAGNOSIS — Z20822 Contact with and (suspected) exposure to covid-19: Secondary | ICD-10-CM | POA: Diagnosis not present

## 2019-09-27 DIAGNOSIS — T7840XA Allergy, unspecified, initial encounter: Secondary | ICD-10-CM | POA: Diagnosis not present

## 2019-09-27 DIAGNOSIS — I10 Essential (primary) hypertension: Secondary | ICD-10-CM | POA: Diagnosis not present

## 2019-11-02 DIAGNOSIS — F988 Other specified behavioral and emotional disorders with onset usually occurring in childhood and adolescence: Secondary | ICD-10-CM | POA: Diagnosis not present

## 2019-11-19 DIAGNOSIS — G5602 Carpal tunnel syndrome, left upper limb: Secondary | ICD-10-CM | POA: Diagnosis not present

## 2019-12-04 DIAGNOSIS — G5602 Carpal tunnel syndrome, left upper limb: Secondary | ICD-10-CM | POA: Diagnosis not present

## 2020-03-07 DIAGNOSIS — M1811 Unilateral primary osteoarthritis of first carpometacarpal joint, right hand: Secondary | ICD-10-CM | POA: Insufficient documentation

## 2020-03-07 DIAGNOSIS — M1812 Unilateral primary osteoarthritis of first carpometacarpal joint, left hand: Secondary | ICD-10-CM | POA: Insufficient documentation

## 2020-05-06 ENCOUNTER — Other Ambulatory Visit: Payer: Self-pay

## 2020-05-06 ENCOUNTER — Encounter: Payer: Self-pay | Admitting: Allergy

## 2020-05-06 ENCOUNTER — Ambulatory Visit (INDEPENDENT_AMBULATORY_CARE_PROVIDER_SITE_OTHER): Payer: BC Managed Care – PPO | Admitting: Allergy

## 2020-05-06 VITALS — BP 120/70 | HR 88 | Temp 98.0°F | Resp 16 | Ht 69.5 in | Wt 221.0 lb

## 2020-05-06 DIAGNOSIS — T783XXD Angioneurotic edema, subsequent encounter: Secondary | ICD-10-CM

## 2020-05-06 DIAGNOSIS — J3089 Other allergic rhinitis: Secondary | ICD-10-CM

## 2020-05-06 DIAGNOSIS — T783XXA Angioneurotic edema, initial encounter: Secondary | ICD-10-CM | POA: Insufficient documentation

## 2020-05-06 DIAGNOSIS — L509 Urticaria, unspecified: Secondary | ICD-10-CM | POA: Diagnosis not present

## 2020-05-06 MED ORDER — LEVOCETIRIZINE DIHYDROCHLORIDE 5 MG PO TABS: 5.0000 mg | ORAL_TABLET | Freq: Two times a day (BID) | ORAL | 3 refills | Status: DC

## 2020-05-06 NOTE — Assessment & Plan Note (Signed)
Rhino conjunctivitis symptoms in the spring and fall. Takes OTC antihistamines and Flonase prn with good benefit. 2021 bloodwork positive to dust mites and dog.  Continue environmental control measures as below.   Continue with Xyzal as above.  May use Flonase (fluticasone) nasal spray 1 spray per nostril twice a day as needed for nasal congestion.

## 2020-05-06 NOTE — Assessment & Plan Note (Addendum)
Mainly having lip angioedema with occasional hives for the past 1-2 years. This can occur at random times anywhere from a few times per week to less than once per month. No triggers noted. Usually has to take benadryl and had 4 steroid injections to date with good benefit. One similar episode over 20 years ago which resolved without finding the cause. Of note, she had COVID-19 in October 2020 and January 2022. She is also on lisinopril for hypertension for 2 years per patient report. 2021 bloodwork - neg alpha gal, normal TSH, positive to dust mites, dog, food panel unremarkable.  Discussed with patient that given above clinical history, I am stopping lisinopril as ace-inhibitors have a known side effect for angioedema. Even if the lisinopril is not the cause of her angioedema anyone with history of angioedema we do not recommend that the patient be on ace-inhibitors in the future as it can precipitate an angioedema event.   Follow up with PCP regarding starting a different antihypertensive medication that is NOT an ace-inhibitor.   No indication for any environmental/food skin testing today.   Start Xyzal 5mg  twice a day.  If symptoms not controlled or causes drowsiness let know.  Continue famotidine 40mg  at night.  . Avoid the following potential triggers: alcohol, tight clothing, NSAIDs, hot showers and getting overheated. o Take Tylenol for pain.  . Get bloodwork to rule out other etiologies.   Keep track of episodes - write down what you had come across that day/eaten that may have been different.  For mild symptoms you can take over the counter antihistamines such as Benadryl and monitor symptoms closely. If symptoms worsen or if you have severe symptoms including breathing issues, throat closure, significant swelling, whole body hives, severe diarrhea and vomiting, lightheadedness then seek immediate medical care.  Emergency action plan given.

## 2020-05-06 NOTE — Patient Instructions (Addendum)
Angioedema/hives:   Stop lisinopril as it can cause swelling.  Even if you have another episode of swelling while not taking lisinopril, any patient with history of swelling is NOT recommended to take ace-inhibitor medications.   Contact PCP to start you on a different blood pressure medication this week.    Start xyzal 5mg  twice a day.  If symptoms not controlled or causes drowsiness let know.  Continue famotidine 40mg  at night.  . Avoid the following potential triggers: alcohol, tight clothing, NSAIDs, hot showers and getting overheated. o Take Tylenol for pain.  . Get bloodwork:  o We are ordering labs, so please allow 1-2 weeks for the results to come back. o With the newly implemented Cures Act, the labs might be visible to you at the same time that they become visible to me. However, I will not address the results until all of the results are back, so please be patient.    Keep track of episodes - write down what you had come across that day/eaten that may have been different.   For mild symptoms you can take over the counter antihistamines such as Benadryl and monitor symptoms closely. If symptoms worsen or if you have severe symptoms including breathing issues, throat closure, significant swelling, whole body hives, severe diarrhea and vomiting, lightheadedness then seek immediate medical care.  Action plan given.  Environmental allergies:   Continue environmental control measures.   Continue with Xyzal as above.  May use Flonase (fluticasone) nasal spray 1 spray per nostril twice a day as needed for nasal congestion.   Follow up in 2 months or sooner if needed.   Control of House Dust Mite Allergen . Dust mite allergens are a common trigger of allergy and asthma symptoms. While they can be found throughout the house, these microscopic creatures thrive in warm, humid environments such as bedding, upholstered furniture and carpeting. . Because so much time is spent  in the bedroom, it is essential to reduce mite levels there.  . Encase pillows, mattresses, and box springs in special allergen-proof fabric covers or airtight, zippered plastic covers.  . Bedding should be washed weekly in hot water (130 F) and dried in a hot dryer. Allergen-proof covers are available for comforters and pillows that can't be regularly washed.  [place] the allergy-proof covers every few months. Minimize clutter in the bedroom. Keep pets out of the bedroom.  Keep humidity less than 50% by using a dehumidifier or air conditioning. You can buy a humidity measuring device called a hygrometer to monitor this.  . If possible, replace carpets with hardwood, linoleum, or washable area rugs. If that's not possible, vacuum frequently with a vacuum that has a HEPA filter. . Remove all upholstered furniture and non-washable window drapes from the bedroom. . Remove all non-washable stuffed toys from the bedroom.  Wash stuffed toys weekly. Pet Allergen Avoidance: . Contrary to popular opinion, there are no "hypoallergenic" breeds of dogs or cats. That is because people are not allergic to an animal's hair, but to an allergen found in the animal's saliva, dander (dead skin flakes) or urine. Pet allergy symptoms typically occur within minutes. For some people, symptoms can build up and become most severe 8 to 12 hours after contact with the animal. People with severe allergies can experience reactions in public places if dander has been transported on the pet owners' clothing. Reyes Ivan Keeping an animal outdoors is only a partial solution, since homes with pets in the yard  still have higher concentrations of animal allergens. . Before getting a pet, ask your allergist to determine if you are allergic to animals. If your pet is already considered part of your family, try to minimize contact and keep the pet out of the bedroom and other rooms where you spend a great deal of time. . As with dust mites, vacuum  carpets often or replace carpet with a hardwood floor, tile or linoleum. . High-efficiency particulate air (HEPA) cleaners can reduce allergen levels over time. . While dander and saliva are the source of cat and dog allergens, urine is the source of allergens from rabbits, hamsters, mice and [place] pigs; so ask a non-allergic family member to clean the animal's cage. . If you have a pet allergy, talk to your allergist about the potential for allergy immunotherapy (allergy shots). This strategy can often provide long-term relief.

## 2020-05-06 NOTE — Progress Notes (Signed)
New Patient Note  RE: Courtney Brooks MRN: [id number] DOB: 05/10/1970 Date of Office Visit: 05/06/2020  Consult requested by: Jettie Booze, NP Primary care provider: Jettie Booze, NP  Chief Complaint: Allergic Reaction (Random lip swelling with red face, and sometimes hives on upper part of legs)  History of Present Illness: I had the pleasure of seeing Courtney Brooks for initial evaluation at the [facility] on 05/06/2020. She is a 50 y.o. female, who is referred here by Jettie Booze, NP for the evaluation of allergic reaction.  Patient having issues with lip swelling, occasional nose swelling and occasional hives on the legs. This started about 1+ year ago but had similar issue with lip swelling many years ago. She was seen by [facility] over 20 years ago and was found to have sensitivities to dust mites, mold and pet dander. That episode resolved without much intervention with no triggering factor identified.   Patient can have an episode anywhere from a few times per week to less than once a month. No recent episode. Symptoms resolve after taking Benadryl if caught early on otherwise she needs a steroid injection. Symptoms lasts less than 1 day. Had 4 steroid injections to date.   Suspected triggers are unknown. Denies any fevers, chills, changes in medications, foods, personal care products or recent infections. She has tried the following therapies: benadryl and steroid injections with good benefit. Currently on Xyzal 96m daily for allergies.  Previous work up includes:  01/15/2019 - food panel (borderline positive to banana, barley, white bean, egg, malt). 01/15/2019 - environmental panel (positive to dust mites, dog) Negative alpha gal.  Patient is up to date with the following cancer screening tests: physical exam, pap smears, mammogram, colonoscopy.  Patient has been on lisinopril for 2 years, patient states that these episodes started after she was started  on this medication. No recent tick bites.  Patient had COVID-19 in October 2020, January 2022 - patient states she had the above issues before getting COVID-19.   Assessment and Plan: DKaytlanis a 50y.o. female with: Angioedema of lips Mainly having lip angioedema with occasional hives for the past 1-2 years. This can occur at random times anywhere from a few times per week to less than once per month. No triggers noted. Usually has to take benadryl and had 4 steroid injections to date with good benefit. One similar episode over 20 years ago which resolved without finding the cause. Of note, she had COVID-19 in October 2020 and January 2022. She is also on lisinopril for hypertension for 2 years per patient report. 2021 bloodwork - neg alpha gal, normal TSH, positive to dust mites, dog, food panel unremarkable.  Discussed with patient that given above clinical history, I am stopping lisinopril as ace-inhibitors have a known side effect for angioedema. Even if the lisinopril is not the cause of her angioedema anyone with history of angioedema we do not recommend that the patient be on ace-inhibitors in the future as it can precipitate an angioedema event.   Follow up with PCP regarding starting a different antihypertensive medication that is NOT an ace-inhibitor.   No indication for any environmental/food skin testing today.   Start Xyzal 564mtwice a day.  If symptoms not controlled or causes drowsiness let usKoreanow.  Continue famotidine 4024mt night.  . Avoid the following potential triggers: alcohol, tight clothing, NSAIDs, hot showers and getting overheated. o Take Tylenol for pain.  .Marland Kitchen  Get bloodwork to rule out other etiologies.   Keep track of episodes - write down what you had come across that day/eaten that may have been different.  For mild symptoms you can take over the counter antihistamines such as Benadryl and monitor symptoms closely. If symptoms worsen or if you have severe symptoms  including breathing issues, throat closure, significant swelling, whole body hives, severe diarrhea and vomiting, lightheadedness then seek immediate medical care.  Emergency action plan given.  Urticaria  See assessment and plan as above.  Other allergic rhinitis Rhino conjunctivitis symptoms in the spring and fall. Takes OTC antihistamines and Flonase prn with good benefit. 2021 bloodwork positive to dust mites and dog.  Continue environmental control measures as below.   Continue with Xyzal as above.  May use Flonase (fluticasone) nasal spray 1 spray per nostril twice a day as needed for nasal congestion.   Return in about 2 months (around 07/06/2020).  Meds ordered this encounter  Medications  . levocetirizine (XYZAL) 5 MG tablet    Sig: Take 1 tablet (5 mg total) by mouth in the morning and at bedtime.    Dispense:  60 tablet    Refill:  3    Lab Orders     ANA w/Reflex     C1 Esterase Inhibitor     C1 esterase inhibitor, functional     C3 and C4     CBC with Differential/Platelet     Chronic Urticaria     Comprehensive metabolic panel     Complement component c1q     C-reactive protein     Tryptase     Sedimentation rate  Other allergy screening: Asthma: no Rhino conjunctivitis: yes  Rhinorrhea, sneezing, watery eyes which flares during the spring and fall. Takes Xyzal daily, benadryl prn and Flonase prn with good benefit. No prior AIT.   Food allergy: no  Medication allergy: yes Hymenoptera allergy: no Eczema:no History of recurrent infections suggestive of immunodeficency: no  Diagnostics: None.  Past Medical History: Patient Active Problem List   Diagnosis Date Noted  . Angioedema of lips 05/06/2020  . Urticaria 05/06/2020  . Other allergic rhinitis 05/06/2020  . Chronic pain 02/09/2018   Past Medical History:  Diagnosis Date  . Endometriosis   . Family history of breast cancer    BRCA testing letter sent 2017  . GERD (gastroesophageal reflux  disease)   . Hx: UTI (urinary tract infection)    Past Surgical History: Past Surgical History:  Procedure Laterality Date  . APPENDECTOMY    . BACK SURGERY  10/2014  . ENDOMETRIAL ABLATION  03/25/2011  . RIGHT OOPHORECTOMY  2006  . SPINAL CORD STIMULATOR INSERTION N/A 02/09/2018   Procedure: LUMBAR SPINAL CORD STIMULATOR INSERTION;  Surgeon: Melina Schools, MD;  Location: [place];  Service: Orthopedics;  Laterality: N/A;  . TUBAL LIGATION  1993   Medication List:  Current Outpatient Medications  Medication Sig Dispense Refill  . EPINEPHrine 0.3 mg/0.3 mL IJ SOAJ injection Inject into the muscle.    . famotidine (PEPCID) 40 MG tablet Take by mouth.    . hydrOXYzine (ATARAX/VISTARIL) 25 MG tablet Take 25 mg by mouth 3 (three) times daily as needed.    Marland Kitchen ibuprofen (ADVIL) 200 MG tablet Take 200 mg by mouth every 6 (six) hours as needed.    Marland Kitchen levocetirizine (XYZAL) 5 MG tablet Take 1 tablet (5 mg total) by mouth in the morning and at bedtime. 60 tablet 3  . RIZATRIPTAN BENZOATE PO  Take 10 mg by mouth daily.     No current facility-administered medications for this visit.   Allergies: Allergies  Allergen Reactions  . Percocet [Oxycodone-Acetaminophen] Itching    Nose itches   Social History: Social History   Socioeconomic History  . Marital status: Married    Spouse name: Not on file  . Number of children: 2  . Years of education: Not on file  . Highest education level: Not on file  Occupational History  . Occupation: Educational psychologist  Tobacco Use  . Smoking status: Never Smoker  . Smokeless tobacco: Never Used  Vaping Use  . Vaping Use: Never used  Substance and Sexual Activity  . Alcohol use: Yes    Alcohol/week: 4.0 standard drinks    Types: 4 Standard drinks or equivalent per week    Comment: occ  . Drug use: No  . Sexual activity: Yes    Birth control/protection: None  Other Topics Concern  . Not on file  Social History Narrative   Daily caffeine    Social  Determinants of Health   Financial Resource Strain: Not on file  Food Insecurity: Not on file  Transportation Needs: Not on file  Physical Activity: Not on file  Stress: Not on file  Social Connections: Not on file   Lives in a 52 year old house. Smoking: denies Occupation: Careers adviser HistoryFreight forwarder in the house: no Carpet in the family room: no Carpet in the bedroom: no Heating: heat pump Cooling: central Pet: no  Family History: Family History  Problem Relation Age of Onset  . Hypertension Mother   . Kidney disease Mother   . Diabetes Mother   . Heart disease Father   . Breast cancer Paternal Grandmother 35  . Lung cancer Maternal Grandmother   . Hypertension Maternal Grandmother   . Colon cancer Neg Hx    Problem                               Relation Asthma                                   No  Eczema                                No  Food allergy                          No  Allergic rhino conjunctivitis     No  Angioedema     No  Review of Systems  Constitutional: Negative for appetite change, chills, fever and unexpected weight change.  HENT: Negative for congestion and rhinorrhea.   Eyes: Negative for itching.  Respiratory: Negative for cough, chest tightness, shortness of breath and wheezing.   Cardiovascular: Negative for chest pain.  Gastrointestinal: Negative for abdominal pain.  Genitourinary: Negative for difficulty urinating.  Skin: Positive for rash.  Neurological: Negative for headaches.   Objective: BP 120/70   Pulse 88   Temp 98 F (36.7 C) (Temporal)   Resp 16   Ht 5' 9.5" (1.765 m)   Wt 221 lb (100.2 kg)   SpO2 98%   BMI 32.17 kg/m  Body mass index is 32.17 kg/m. Physical Exam Vitals and nursing note reviewed.  Constitutional:      Appearance: Normal appearance. She is well-developed.  HENT:     Head: Normocephalic and atraumatic.     Right Ear: External ear normal.     Left Ear: External ear  normal.     Nose: Nose normal.     Mouth/Throat:     Mouth: Mucous membranes are moist.     Pharynx: Oropharynx is clear.  Eyes:     Conjunctiva/sclera: Conjunctivae normal.  Cardiovascular:     Rate and Rhythm: Normal rate and regular rhythm.     Heart sounds: Normal heart sounds. No murmur heard. No friction rub. No gallop.   Pulmonary:     Effort: Pulmonary effort is normal.     Breath sounds: Normal breath sounds. No wheezing, rhonchi or rales.  Abdominal:     Palpations: Abdomen is soft.  Musculoskeletal:     Cervical back: Neck supple.  Skin:    General: Skin is warm.     Findings: No rash.  Neurological:     Mental Status: She is alert and oriented to person, place, and time.  Psychiatric:        Behavior: Behavior normal.    The plan was reviewed with the patient/family, and all questions/concerned were addressed.  It was my pleasure to see Austynn today and participate in her care. Please feel free to contact me with any questions or concerns.  Sincerely,  Rexene Alberts, DO Allergy & Immunology  [facility] office: [place] office: [phone]

## 2020-05-06 NOTE — Assessment & Plan Note (Signed)
.   See assessment and plan as above. 

## 2020-05-21 LAB — CBC WITH DIFFERENTIAL/PLATELET
Basophils Absolute: 0.1 10*3/uL (ref 0.0–0.2)
Basos: 1 %
EOS (ABSOLUTE): 0.1 10*3/uL (ref 0.0–0.4)
Eos: 1 %
Hematocrit: 44.8 % (ref 34.0–46.6)
Hemoglobin: 14.5 g/dL (ref 11.1–15.9)
Immature Grans (Abs): 0 10*3/uL (ref 0.0–0.1)
Immature Granulocytes: 0 %
Lymphocytes Absolute: 2.9 10*3/uL (ref 0.7–3.1)
Lymphs: 37 %
MCH: 29.6 pg (ref 26.6–33.0)
MCHC: 32.4 g/dL (ref 31.5–35.7)
MCV: 91 fL (ref 79–97)
Monocytes Absolute: 0.6 10*3/uL (ref 0.1–0.9)
Monocytes: 8 %
Neutrophils Absolute: 4.2 10*3/uL (ref 1.4–7.0)
Neutrophils: 53 %
Platelets: 401 10*3/uL (ref 150–450)
RBC: 4.9 x10E6/uL (ref 3.77–5.28)
RDW: 12.7 % (ref 11.7–15.4)
WBC: 7.8 10*3/uL (ref 3.4–10.8)

## 2020-05-21 LAB — COMPREHENSIVE METABOLIC PANEL
ALT: 22 IU/L (ref 0–32)
AST: 15 IU/L (ref 0–40)
Albumin/Globulin Ratio: 1.5 (ref 1.2–2.2)
Albumin: 4.6 g/dL (ref 3.8–4.9)
Alkaline Phosphatase: 91 IU/L (ref 44–121)
BUN/Creatinine Ratio: 21 (ref 9–23)
BUN: 21 mg/dL (ref 6–24)
Bilirubin Total: <0.2 mg/dL (ref 0.0–1.2)
CO2: 24 mmol/L (ref 20–29)
Calcium: 9.7 mg/dL (ref 8.7–10.2)
Chloride: 99 mmol/L (ref 96–106)
Creatinine, Ser: 0.98 mg/dL (ref 0.57–1.00)
Globulin, Total: 3 g/dL (ref 1.5–4.5)
Glucose: 89 mg/dL (ref 65–99)
Potassium: 4.5 mmol/L (ref 3.5–5.2)
Sodium: 138 mmol/L (ref 134–144)
Total Protein: 7.6 g/dL (ref 6.0–8.5)
eGFR: 70 mL/min/{1.73_m2} (ref >59)

## 2020-05-21 LAB — CHRONIC URTICARIA: cu index: 2.5 (ref <10)

## 2020-05-21 LAB — COMPLEMENT COMPONENT C1Q: Complement C1Q: 17 mg/dL (ref 10.3–20.5)

## 2020-05-21 LAB — C-REACTIVE PROTEIN: CRP: 2 mg/L (ref 0–10)

## 2020-05-21 LAB — C1 ESTERASE INHIBITOR: C1INH SerPl-mCnc: 30 mg/dL (ref 21–39)

## 2020-05-21 LAB — TRYPTASE: Tryptase: 3.7 ug/L (ref 2.2–13.2)

## 2020-05-21 LAB — C3 AND C4
Complement C3, Serum: 146 mg/dL (ref 82–167)
Complement C4, Serum: 24 mg/dL (ref 12–38)

## 2020-05-21 LAB — C1 ESTERASE INHIBITOR, FUNCTIONAL: C1INH Functional/C1INH Total MFr SerPl: >100 %mean normal

## 2020-05-21 LAB — SEDIMENTATION RATE: Sed Rate: 2 mm/hr (ref 0–40)

## 2020-05-21 LAB — ANA W/REFLEX: Anti Nuclear Antibody (ANA): NEGATIVE

## 2020-07-08 ENCOUNTER — Ambulatory Visit: Payer: BC Managed Care – PPO | Admitting: Allergy

## 2020-07-08 DIAGNOSIS — J309 Allergic rhinitis, unspecified: Secondary | ICD-10-CM

## 2020-07-08 NOTE — Progress Notes (Deleted)
Follow Up Note  RE: Courtney Brooks MRN: [id number] DOB: Jan 25, 1970 Date of Office Visit: 07/08/2020  Referring provider: April Manson, NP Primary care provider: April Manson, NP  Chief Complaint: No chief complaint on file.  History of Present Illness: I had the pleasure of seeing Courtney Brooks for a follow up visit at the [facility] on 07/08/2020. She is a 50 y.o. female, who is being followed for lip angioedema, urticaria and allergic rhinoconjunctivitis. Her previous allergy office visit was on 05/06/2020 with Dr. Selena Batten. Today is a regular follow up visit.  I reviewed the bloodwork. Blood count, kidney function, liver function, electrolytes, autoimmune screener, inflammation markers, chronic urticaria index (checks for autoantibodies that trigger mast cells), tryptase (checks for mast cell issues) and angioedema panel (checks for some swelling issues) were all normal which isgreat.    Based on these results, still concerned if the lisinopril was causing yourswelling - please continue to avoid this medication.     Angioedema of lips Mainly having lip angioedema with occasional hives for the past 1-2 years. This can occur at random times anywhere from a few times per week to less than once per month. No triggers noted. Usually has to take benadryl and had 4 steroid injections to date with good benefit. One similar episode over 20 years ago which resolved without finding the cause. Of note, she had COVID-19 in October 2020 and January 2022. She is also on lisinopril for hypertension for 2 years per patient report. 2021 bloodwork - neg alpha gal, normal TSH, positive to dust mites, dog, food panel unremarkable. Discussed with patient that given above clinical history, I am stopping lisinopril as ace-inhibitors have a known side effect for angioedema. Even if the lisinopril is not the cause of her angioedema anyone with history of angioedema we do not recommend that the patient be  on ace-inhibitors in the future as it can precipitate an angioedema event. Follow up with PCP regarding starting a different antihypertensive medication that is NOT an ace-inhibitor. No indication for any environmental/food skin testing today.  Start Xyzal 5mg  twice a day. If symptoms not controlled or causes drowsiness let know. Continue famotidine 40mg  at night. Avoid the following potential triggers: alcohol, tight clothing, NSAIDs, hot showers and getting overheated. Take Tylenol for pain. Get bloodwork to rule out other etiologies.  Keep track of episodes - write down what you had come across that day/eaten that may have been different. For mild symptoms you can take over the counter antihistamines such as Benadryl and monitor symptoms closely. If symptoms worsen or if you have severe symptoms including breathing issues, throat closure, significant swelling, whole body hives, severe diarrhea and vomiting, lightheadedness then seek immediate medical care. Emergency action plan given.   Urticaria See assessment and plan as above.   Other allergic rhinitis Rhino conjunctivitis symptoms in the spring and fall. Takes OTC antihistamines and Flonase prn with good benefit. 2021 bloodwork positive to dust mites and dog. Continue environmental control measures as below.  Continue with Xyzal as above. May use Flonase (fluticasone) nasal spray 1 spray per nostril twice a day as needed for nasal congestion.    Return in about 2 months (around 07/06/2020).  Assessment and Plan: Samaiya is a 50 y.o. female with: No problem-specific Assessment & Plan notes found for this encounter.  No follow-ups on file.  No orders of the defined types were placed in this encounter.  Lab Orders  No laboratory  test(s) ordered today    Diagnostics: Spirometry:  Tracings reviewed. Her effort: {Blank single:19197::"Good reproducible efforts.","It was hard to get consistent efforts and there is a question as to  whether this reflects a maximal maneuver.","Poor effort, data can not be interpreted."} FVC: ***L FEV1: ***L, ***% predicted FEV1/FVC ratio: ***% Interpretation: {Blank single:19197::"Spirometry consistent with mild obstructive disease","Spirometry consistent with moderate obstructive disease","Spirometry consistent with severe obstructive disease","Spirometry consistent with possible restrictive disease","Spirometry consistent with mixed obstructive and restrictive disease","Spirometry uninterpretable due to technique","Spirometry consistent with normal pattern","No overt abnormalities noted given today's efforts"}.  Please see scanned spirometry results for details.  Skin Testing: {Blank single:19197::"Select foods","Environmental allergy panel","Environmental allergy panel and select foods","Food allergy panel","None","Deferred due to recent antihistamines use"}. Positive test to: ***. Negative test to: ***.  Results discussed with patient/family.   Medication List:  Current Outpatient Medications  Medication Sig Dispense Refill   EPINEPHrine 0.3 mg/0.3 mL IJ SOAJ injection Inject into the muscle.     famotidine (PEPCID) 40 MG tablet Take by mouth.     hydrOXYzine (ATARAX/VISTARIL) 25 MG tablet Take 25 mg by mouth 3 (three) times daily as needed.     ibuprofen (ADVIL) 200 MG tablet Take 200 mg by mouth every 6 (six) hours as needed.     levocetirizine (XYZAL) 5 MG tablet Take 1 tablet (5 mg total) by mouth in the morning and at bedtime. 60 tablet 3   RIZATRIPTAN BENZOATE PO Take 10 mg by mouth daily.     No current facility-administered medications for this visit.   Allergies: Allergies  Allergen Reactions   Percocet [Oxycodone-Acetaminophen] Itching    Nose itches   I reviewed her past medical history, social history, family history, and environmental history and no significant changes have been reported from her previous visit.  Review of Systems  Constitutional:  Negative for  appetite change, chills, fever and unexpected weight change.  HENT:  Negative for congestion and rhinorrhea.   Eyes:  Negative for itching.  Respiratory:  Negative for cough, chest tightness, shortness of breath and wheezing.   Cardiovascular:  Negative for chest pain.  Gastrointestinal:  Negative for abdominal pain.  Genitourinary:  Negative for difficulty urinating.  Skin:  Positive for rash.  Neurological:  Negative for headaches.   Objective: There were no vitals taken for this visit. There is no height or weight on file to calculate BMI. Physical Exam Vitals and nursing note reviewed.  Constitutional:      Appearance: Normal appearance. She is well-developed.  HENT:     Head: Normocephalic and atraumatic.     Right Ear: External ear normal.     Left Ear: External ear normal.     Nose: Nose normal.     Mouth/Throat:     Mouth: Mucous membranes are moist.     Pharynx: Oropharynx is clear.  Eyes:     Conjunctiva/sclera: Conjunctivae normal.  Cardiovascular:     Rate and Rhythm: Normal rate and regular rhythm.     Heart sounds: Normal heart sounds. No murmur heard.   No friction rub. No gallop.  Pulmonary:     Effort: Pulmonary effort is normal.     Breath sounds: Normal breath sounds. No wheezing, rhonchi or rales.  Abdominal:     Palpations: Abdomen is soft.  Musculoskeletal:     Cervical back: Neck supple.  Skin:    General: Skin is warm.     Findings: No rash.  Neurological:     Mental Status: She is alert and  oriented to person, place, and time.  Psychiatric:        Behavior: Behavior normal.   Previous notes and tests were reviewed. The plan was reviewed with the patient/family, and all questions/concerned were addressed.  It was my pleasure to see Akaiya today and participate in her care. Please feel free to contact me with any questions or concerns.  Sincerely,  Wyline Mood, DO Allergy & Immunology  [facility]  office: [phone] [facility] office: [phone]

## 2020-12-15 ENCOUNTER — Other Ambulatory Visit (HOSPITAL_COMMUNITY): Payer: Self-pay | Admitting: Physician Assistant

## 2020-12-15 ENCOUNTER — Other Ambulatory Visit: Payer: Self-pay | Admitting: Physician Assistant

## 2020-12-15 DIAGNOSIS — M25511 Pain in right shoulder: Secondary | ICD-10-CM

## 2020-12-26 ENCOUNTER — Ambulatory Visit (HOSPITAL_COMMUNITY)
Admission: RE | Admit: 2020-12-26 | Discharge: 2020-12-26 | Disposition: A | Discharge status: Home / Self Care | Payer: BC Managed Care – PPO | Source: Ambulatory Visit | Attending: Physician Assistant | Admitting: Physician Assistant

## 2020-12-26 ENCOUNTER — Other Ambulatory Visit: Payer: Self-pay

## 2020-12-26 DIAGNOSIS — M25511 Pain in right shoulder: Secondary | ICD-10-CM | POA: Insufficient documentation

## 2021-04-01 ENCOUNTER — Ambulatory Visit: Payer: BC Managed Care – PPO | Admitting: Obstetrics and Gynecology

## 2021-05-25 NOTE — Progress Notes (Unsigned)
PCP: April Manson, NP   No chief complaint on file.   HPI:      Courtney Brooks is a 51 y.o. [phone] whose LMP was No LMP recorded. Patient has had an ablation., presents today for her annual examination.  Her menses are absent due to endometrial ablation.  She {does:18564} have vasomotor sx.   Sex activity: {sex active: 315163}. She {does:18564} have vaginal dryness.  Last Pap: 02/05/19 Results were: no abnormalities /neg HPV DNA.  Hx of STDs: {STD hx:14358}  Last mammogram: 05/13/21 at [facility];  Results were: Cat 4 LT breast due to palpable mass; bx being scheduled.  There is no FH of breast cancer. There is no FH of ovarian cancer. The patient {does:18564} do self-breast exams.  Colonoscopy: {hx:15363}  Repeat due after 10*** years.   Tobacco use: {tob:20664} Alcohol use: {Alcohol:11675} No drug use Exercise: {exercise:31265}  She {does:18564} get adequate calcium and Vitamin D in her diet.  Labs with PCP.   Patient Active Problem List   Diagnosis Date Noted   Angioedema of lips 05/06/2020   Urticaria 05/06/2020   Other allergic rhinitis 05/06/2020   Chronic pain 02/09/2018    Past Surgical History:  Procedure Laterality Date   APPENDECTOMY     BACK SURGERY  10/2014   ENDOMETRIAL ABLATION  03/25/2011   RIGHT OOPHORECTOMY  2006   SPINAL CORD STIMULATOR INSERTION N/A 02/09/2018   Procedure: LUMBAR SPINAL CORD STIMULATOR INSERTION;  Surgeon: Venita Lick, MD;  Location: [facility] OR;  Service: Orthopedics;  Laterality: N/A;   TUBAL LIGATION  1993    Family History  Problem Relation Age of Onset   Hypertension Mother    Kidney disease Mother    Diabetes Mother    Heart disease Father    Breast cancer Paternal Grandmother 59   Lung cancer Maternal Grandmother    Hypertension Maternal Grandmother    Colon cancer Neg Hx     Social History   Socioeconomic History   Marital status: Married    Spouse name: Not on file   Number of children: 2   Years of  education: Not on file   Highest education level: Not on file  Occupational History   Occupation: Furniture conservator/restorer  Tobacco Use   Smoking status: Never   Smokeless tobacco: Never  Vaping Use   Vaping Use: Never used  Substance and Sexual Activity   Alcohol use: Yes    Alcohol/week: 4.0 standard drinks    Types: 4 Standard drinks or equivalent per week    Comment: occ   Drug use: No   Sexual activity: Yes    Birth control/protection: None  Other Topics Concern   Not on file  Social History Narrative   Daily caffeine    Social Determinants of Health   Financial Resource Strain: Not on file  Food Insecurity: Not on file  Transportation Needs: Not on file  Physical Activity: Not on file  Stress: Not on file  Social Connections: Not on file  Intimate Partner Violence: Not on file     Current Outpatient Medications:    EPINEPHrine 0.3 mg/0.3 mL IJ SOAJ injection, Inject into the muscle., Disp: , Rfl:    famotidine (PEPCID) 40 MG tablet, Take by mouth., Disp: , Rfl:    hydrOXYzine (ATARAX/VISTARIL) 25 MG tablet, Take 25 mg by mouth 3 (three) times daily as needed., Disp: , Rfl:    ibuprofen (ADVIL) 200 MG tablet, Take 200 mg by mouth every 6 (six)  hours as needed., Disp: , Rfl:    levocetirizine (XYZAL) 5 MG tablet, Take 1 tablet (5 mg total) by mouth in the morning and at bedtime., Disp: 60 tablet, Rfl: 3   RIZATRIPTAN BENZOATE PO, Take 10 mg by mouth daily., Disp: , Rfl:      ROS:  Review of Systems BREAST: No symptoms    Objective: There were no vitals taken for this visit.   OBGyn Exam  Results: No results found for this or any previous visit (from the past 24 hour(s)).  Assessment/Plan:  No diagnosis found.   No orders of the defined types were placed in this encounter.           GYN counsel {counseling: 16159}    F/U  No follow-ups on file.  Skyler Dusing B. Cleatis Fandrich, PA-C 05/25/2021 11:26 AM

## 2021-05-26 ENCOUNTER — Ambulatory Visit (INDEPENDENT_AMBULATORY_CARE_PROVIDER_SITE_OTHER): Payer: BC Managed Care – PPO | Admitting: Obstetrics and Gynecology

## 2021-05-26 ENCOUNTER — Encounter: Payer: Self-pay | Admitting: Obstetrics and Gynecology

## 2021-05-26 VITALS — BP 140/90 | Ht 70.0 in | Wt 225.0 lb

## 2021-05-26 DIAGNOSIS — Z01419 Encounter for gynecological examination (general) (routine) without abnormal findings: Secondary | ICD-10-CM

## 2021-05-26 DIAGNOSIS — N6321 Unspecified lump in the left breast, upper outer quadrant: Secondary | ICD-10-CM

## 2021-05-26 DIAGNOSIS — Z1231 Encounter for screening mammogram for malignant neoplasm of breast: Secondary | ICD-10-CM

## 2021-05-26 DIAGNOSIS — Z79890 Hormone replacement therapy (postmenopausal): Secondary | ICD-10-CM | POA: Diagnosis not present

## 2021-05-26 DIAGNOSIS — Z1211 Encounter for screening for malignant neoplasm of colon: Secondary | ICD-10-CM

## 2021-05-26 DIAGNOSIS — N951 Menopausal and female climacteric states: Secondary | ICD-10-CM

## 2021-05-26 MED ORDER — PROGESTERONE MICRONIZED 100 MG PO CAPS
ORAL_CAPSULE | ORAL | 0 refills | Status: DC
Start: 2021-05-26 — End: 2021-09-18

## 2021-05-26 NOTE — Patient Instructions (Signed)
I value your feedback and you entrusting us with your care. If you get a [facility] patient survey, I would appreciate you taking the time to let us know about your experience today. Thank you! ? ? ?

## 2021-06-17 ENCOUNTER — Other Ambulatory Visit (HOSPITAL_COMMUNITY): Payer: Self-pay | Admitting: Orthopedic Surgery

## 2021-06-17 ENCOUNTER — Other Ambulatory Visit: Payer: Self-pay | Admitting: Orthopedic Surgery

## 2021-06-17 DIAGNOSIS — M25511 Pain in right shoulder: Secondary | ICD-10-CM

## 2021-06-30 ENCOUNTER — Encounter: Payer: Self-pay | Admitting: Gastroenterology

## 2021-07-01 ENCOUNTER — Ambulatory Visit (HOSPITAL_COMMUNITY)
Admission: RE | Admit: 2021-07-01 | Discharge: 2021-07-01 | Disposition: A | Discharge status: Home / Self Care | Payer: BC Managed Care – PPO | Source: Ambulatory Visit | Attending: Orthopedic Surgery | Admitting: Orthopedic Surgery

## 2021-07-01 ENCOUNTER — Encounter (HOSPITAL_COMMUNITY): Payer: Self-pay

## 2021-07-01 DIAGNOSIS — M25511 Pain in right shoulder: Secondary | ICD-10-CM

## 2021-07-17 ENCOUNTER — Encounter: Payer: Self-pay | Admitting: Gastroenterology

## 2021-08-11 ENCOUNTER — Ambulatory Visit (AMBULATORY_SURGERY_CENTER): Payer: BC Managed Care – PPO | Admitting: *Deleted

## 2021-08-11 VITALS — Ht 70.0 in | Wt 220.0 lb

## 2021-08-11 DIAGNOSIS — Z1211 Encounter for screening for malignant neoplasm of colon: Secondary | ICD-10-CM

## 2021-08-11 MED ORDER — NA SULFATE-K SULFATE-MG SULF 17.5-3.13-1.6 GM/177ML PO SOLN: 1.0000 | ORAL | 0 refills | Status: DC

## 2021-08-11 NOTE — Progress Notes (Signed)
Patient's pre-visit was done today over the phone with the patient. Name,DOB and address verified. Patient denies any allergies to Eggs and Soy. Patient denies any problems with anesthesia/sedation. Patient is not taking any diet pills or blood thinners. No home Oxygen. Insurance confirmed with patient.  Went over prep instructions with patient. Prep instructions sent to pt's MyChart & mailed to pt-pt is aware. Patient understands to call us back with any questions or concerns. Patient is aware of our care-partner policy.     

## 2021-09-01 ENCOUNTER — Encounter: Payer: BC Managed Care – PPO | Admitting: Gastroenterology

## 2021-09-08 ENCOUNTER — Encounter: Payer: BC Managed Care – PPO | Admitting: Gastroenterology

## 2021-09-10 ENCOUNTER — Other Ambulatory Visit: Payer: Self-pay | Admitting: Orthopedic Surgery

## 2021-09-10 ENCOUNTER — Other Ambulatory Visit: Payer: Self-pay | Admitting: Obstetrics and Gynecology

## 2021-09-10 DIAGNOSIS — G8929 Other chronic pain: Secondary | ICD-10-CM

## 2021-09-10 DIAGNOSIS — Z79890 Hormone replacement therapy (postmenopausal): Secondary | ICD-10-CM

## 2021-09-10 DIAGNOSIS — N951 Menopausal and female climacteric states: Secondary | ICD-10-CM

## 2021-09-11 ENCOUNTER — Telehealth: Payer: Self-pay | Admitting: Obstetrics and Gynecology

## 2021-09-11 NOTE — Telephone Encounter (Signed)
Reached out to pt to schedule telephone visit with [facility].  After scheduling the visit, we can send a medication refill to triage.  Left message for pt to call back to schedule.

## 2021-09-11 NOTE — Telephone Encounter (Signed)
Tried to reach pt via phone, no answer.  Sent a MyChart message and completed a telephone encounter.

## 2021-09-14 NOTE — Telephone Encounter (Signed)
Pt is scheduled with [facility] on 9/26 for medication fu telephone appt.

## 2021-09-14 NOTE — Telephone Encounter (Signed)
Patient reports she has enough medication to last to appointment. No refill needed at this time.

## 2021-09-15 ENCOUNTER — Ambulatory Visit
Admission: RE | Admit: 2021-09-15 | Discharge: 2021-09-15 | Disposition: A | Discharge status: Home / Self Care | Payer: BC Managed Care – PPO | Source: Ambulatory Visit | Attending: Orthopedic Surgery | Admitting: Orthopedic Surgery

## 2021-09-15 DIAGNOSIS — M545 Low back pain, unspecified: Secondary | ICD-10-CM

## 2021-09-15 MED ORDER — MEPERIDINE HCL 50 MG/ML IJ SOLN: 50.0000 mg | Freq: Once | INTRAMUSCULAR | Status: DC | PRN

## 2021-09-15 MED ORDER — IOPAMIDOL (ISOVUE-M 200) INJECTION 41%
15.0000 mL | Freq: Once | INTRAMUSCULAR | Status: AC
  Administered 2021-09-15: 15 mL via INTRATHECAL

## 2021-09-15 MED ORDER — DIAZEPAM 5 MG PO TABS
10.0000 mg | ORAL_TABLET | Freq: Once | ORAL | Status: AC
  Administered 2021-09-15: 5 mg via ORAL

## 2021-09-15 MED ORDER — ONDANSETRON HCL 4 MG/2ML IJ SOLN: 4.0000 mg | Freq: Once | INTRAMUSCULAR | Status: DC | PRN

## 2021-09-15 NOTE — Progress Notes (Signed)
Pt has spinal cord stimulator and reports she has turned it off for her CT myelogram procedure.  

## 2021-09-15 NOTE — Discharge Instructions (Signed)
Myelogram Discharge Instructions  Go home and rest quietly as needed. You may resume normal activities; however, do not exert yourself strongly or do any heavy lifting today and tomorrow.   DO NOT drive today.    You may resume your normal diet and medications unless otherwise indicated. Drink a lot of extra fluids today and tomorrow.   The incidence of a spinal headache (headache, nausea and/or vomiting is about 5% (one in 20 patients).  If you develop a headache when you are sitting up or standing that gets better when you lie down, please lie flat for 24 hours and drink plenty of fluids until the headache goes away.  Caffeinated beverages may be helpful.   If you develop severe nausea and vomiting or a headache that does not go away with the flat bedrest after 48 hours, please call [phone].   Call your physician for a follow-up appointment.  The results of your myelogram will be sent directly to your physician by the following day.  Please call us at [phone] if you have any questions or if complications develop after you arrive home.   Discharge instructions have been explained to the patient.  The patient, or the person responsible for the patient, state they fully understands these instructions.   Thank you for visiting our office today.   

## 2021-09-22 NOTE — Telephone Encounter (Signed)
This pt is scheduled for a phone visit with [facility] on 9/26.  Can she get her RX refilled?

## 2021-09-22 NOTE — Telephone Encounter (Signed)
Left generic msg that refill sent in.

## 2021-09-28 ENCOUNTER — Other Ambulatory Visit: Payer: Self-pay | Admitting: Obstetrics and Gynecology

## 2021-09-28 DIAGNOSIS — N951 Menopausal and female climacteric states: Secondary | ICD-10-CM

## 2021-09-28 DIAGNOSIS — Z79890 Hormone replacement therapy (postmenopausal): Secondary | ICD-10-CM

## 2021-09-28 MED ORDER — PROGESTERONE MICRONIZED 100 MG PO CAPS: ORAL_CAPSULE | ORAL | 2 refills | Status: DC

## 2021-09-28 NOTE — Progress Notes (Addendum)
Rx RF prometrium for vasomotor sx. Started 5/23, doing well. Will cont for now. F/u 5/24 annual

## 2021-09-28 NOTE — Telephone Encounter (Signed)
Followed up with pt per [facility] regarding HRT med, how is she doing? Pt states hot flashes have improved tremendously, she is doing fine.

## 2021-09-28 NOTE — Telephone Encounter (Signed)
Pt aware. FD notified to cancel tomorrow's appt.

## 2021-09-28 NOTE — Telephone Encounter (Signed)
Pls notify pt Rx RF eRxd. F/u 5/24 annual.  No need for appt tom unless she needs something else. If not, pls have FD cancel appt. Thx.

## 2021-09-29 ENCOUNTER — Ambulatory Visit: Payer: BC Managed Care – PPO | Admitting: Obstetrics and Gynecology

## 2021-10-05 ENCOUNTER — Encounter: Payer: Self-pay | Admitting: Gastroenterology

## 2021-10-16 ENCOUNTER — Telehealth: Payer: Self-pay | Admitting: Gastroenterology

## 2021-10-16 NOTE — Telephone Encounter (Signed)
Inbound call from patient stating that her pharmacy advised her that they have not received a prescription for her prep. Patient is scheduled for colonoscopy on 10/16 at 10:30 and is requesting it be resent. Please advise.

## 2021-10-16 NOTE — Telephone Encounter (Signed)
[place] had been returned to stock bc nobody picked it up.  It is now available for pick up and patient notified.

## 2021-10-19 ENCOUNTER — Encounter: Payer: Self-pay | Admitting: Gastroenterology

## 2021-10-19 ENCOUNTER — Ambulatory Visit (AMBULATORY_SURGERY_CENTER): Payer: BC Managed Care – PPO | Admitting: Gastroenterology

## 2021-10-19 VITALS — BP 129/93 | HR 79 | Temp 96.6°F | Resp 16 | Ht 70.0 in | Wt 220.0 lb

## 2021-10-19 DIAGNOSIS — Z1211 Encounter for screening for malignant neoplasm of colon: Secondary | ICD-10-CM

## 2021-10-19 MED ORDER — SODIUM CHLORIDE 0.9 % IV SOLN: 500.0000 mL | Freq: Once | INTRAVENOUS | Status: DC

## 2021-10-19 NOTE — Progress Notes (Signed)
Pt's states no medical or surgical changes since previsit or office visit. 

## 2021-10-19 NOTE — Progress Notes (Signed)
History and Physical:  This patient presents for endoscopic testing for: Encounter Diagnosis  Name Primary?   Special screening for malignant neoplasms, colon Yes    51 yo woman here today for her for screening colonoscopy. She underwent diagnostic colonoscopy with Dr. Ardis Hughs in August 2013 for abdominal pain and constipation as well as heme positive stool.  Patient is otherwise without complaints or active issues today.   Past Medical History: Past Medical History:  Diagnosis Date   Endometriosis    Family history of breast cancer    [facility]CA testing letter sent 2017   GERD (gastroesophageal reflux disease)    Hx: UTI (urinary tract infection)    Hypertension      Past Surgical History: Past Surgical History:  Procedure Laterality Date   APPENDECTOMY     BACK SURGERY  10/2014   CARPAL TUNNEL RELEASE Bilateral    COLONOSCOPY  08/17/2011   Dr.Jacobs   ENDOMETRIAL ABLATION  03/25/2011   RIGHT OOPHORECTOMY  2006   SHOULDER SURGERY Right    SPINAL CORD STIMULATOR INSERTION N/A 02/09/2018   Procedure: LUMBAR SPINAL CORD STIMULATOR INSERTION;  Surgeon: Melina Schools, MD;  Location: [facility];  Service: Orthopedics;  Laterality: N/A;   THUMB ARTHROSCOPY     TUBAL LIGATION  1993    Allergies: Allergies  Allergen Reactions   Percocet [Oxycodone-Acetaminophen] Itching    Nose itches    Outpatient Meds: Current Outpatient Medications  Medication Sig Dispense Refill   amLODipine (NORVASC) 5 MG tablet Take 5 mg by mouth daily.     cyclobenzaprine (FLEXERIL) 10 MG tablet Take 10 mg by mouth at bedtime.     famotidine (PEPCID) 40 MG tablet Take by mouth.     fluticasone (FLONASE) 50 MCG/ACT nasal spray Place 1 spray into both nostrils 2 (two) times daily.     gabapentin (NEURONTIN) 300 MG capsule Take 300 mg by mouth 3 (three) times daily.     lactulose (CHRONULAC) 10 GM/15ML solution Take by mouth.     lisdexamfetamine (VYVANSE) 40 MG capsule Take 40 mg by mouth daily.      progesterone (PROMETRIUM) 100 MG capsule TAKE 1 CAPSULE NIGHTLY (6 NIGHTS ON & 1 NIGHT OFF) 90 capsule 2   Cholecalciferol (VITAMIN D3 PO) Take by mouth.     EPINEPHrine 0.3 mg/0.3 mL IJ SOAJ injection Inject into the muscle. (Patient not taking: Reported on 08/11/2021)     ibuprofen (ADVIL) 200 MG tablet Take 200 mg by mouth every 6 (six) hours as needed.     naproxen (NAPROSYN) 500 MG tablet Take 500 mg by mouth 2 (two) times daily.     rizatriptan (MAXALT-MLT) 10 MG disintegrating tablet  (Patient not taking: Reported on 08/11/2021)     Current Facility-Administered Medications  Medication Dose Route Frequency Provider Last Rate Last Admin   0.9 %  sodium chloride infusion  500 mL Intravenous Once Doran Stabler, MD          ___________________________________________________________________ Objective   Exam:  BP (!) 138/99   Pulse (!) 109   Temp (!) 96.6 F (35.9 C)   Ht _0  (1.778 m)   Wt 220 lb (99.8 kg)   SpO2 98%   BMI 31.57 kg/m   CV: regular , S1/S2 Resp: clear to auscultation bilaterally, normal RR and effort noted GI: soft, no tenderness, with active bowel sounds.   Assessment: Encounter Diagnosis  Name Primary?   Special screening for malignant neoplasms, colon Yes     Plan:  Colonoscopy  The benefits and risks of the planned procedure were described in detail with the patient or (when appropriate) their health care proxy.  Risks were outlined as including, but not limited to, bleeding, infection, perforation, adverse medication reaction leading to cardiac or pulmonary decompensation, pancreatitis (if ERCP).  The limitation of incomplete mucosal visualization was also discussed.  No guarantees or warranties were given.    The patient is appropriate for an endoscopic procedure in the ambulatory setting.   - Wilfrid Lund, MD

## 2021-10-19 NOTE — Op Note (Signed)
[facility] Patient Name: Courtney Brooks Procedure Date: 10/19/2021 10:26 AM MRN: [id number] Endoscopist: Sherilyn Cooter L. Myrtie Neither , MD Age: 51 Referring MD:  Date of Birth: [birth year] Gender: Female Account #: [id number] Procedure:                Colonoscopy Indications:              Screening for colorectal malignant neoplasm, This                            is the patient's first screening colonoscopy (had a                            diagnostic colonoscopy Aug 2013) Medicines:                Monitored Anesthesia Care Procedure:                Pre-Anesthesia Assessment:                           - Prior to the procedure, a History and Physical                            was performed, and patient medications and                            allergies were reviewed. The patient's tolerance of                            previous anesthesia was also reviewed. The risks                            and benefits of the procedure and the sedation                            options and risks were discussed with the patient.                            All questions were answered, and informed consent                            was obtained. Prior Anticoagulants: The patient has                            taken no previous anticoagulant or antiplatelet                            agents. ASA Grade Assessment: II - A patient with                            mild systemic disease. After reviewing the risks                            and benefits, the patient was deemed in  satisfactory condition to undergo the procedure.                           After obtaining informed consent, the colonoscope                            was passed under direct vision. Throughout the                            procedure, the patient's blood pressure, pulse, and                            oxygen saturations were monitored continuously. The                            Olympus CF-HQ190L ([id number])  Colonoscope was                            introduced through the anus and advanced to the the                            cecum, identified by appendiceal orifice and                            ileocecal valve. The colonoscopy was somewhat                            difficult due to a redundant colon. Successful                            completion of the procedure was aided by using                            manual pressure and straightening and shortening                            the scope to obtain bowel loop reduction. The                            patient tolerated the procedure well. The quality                            of the bowel preparation was excellent. The                            ileocecal valve, appendiceal orifice, and rectum                            were photographed. Scope In: 10:36:04 AM Scope Out: 10:50:46 AM Scope Withdrawal Time: 0 hours 8 minutes 16 seconds  Total Procedure Duration: 0 hours 14 minutes 42 seconds  Findings:                 The perianal and digital rectal examinations were  normal.                           There is no endoscopic evidence of polyps in the                            entire colon.                           Repeat examination of right colon under NBI                            performed.                           Internal hemorrhoids were found.                           The exam was otherwise without abnormality on                            direct and retroflexion views. Complications:            No immediate complications. Estimated Blood Loss:     Estimated blood loss: none. Impression:               - Internal hemorrhoids.                           - The examination was otherwise normal on direct                            and retroflexion views.                           - No specimens collected. Recommendation:           - Patient has a contact number available for                             emergencies. The signs and symptoms of potential                            delayed complications were discussed with the                            patient. Return to normal activities tomorrow.                            Written discharge instructions were provided to the                            patient.                           - Resume previous diet.                           - Continue present medications.                           -  Repeat colonoscopy in 10 years for screening                            purposes. Maverick Dieudonne L. Myrtie Neither, MD 10/19/2021 10:56:00 AM This report has been signed electronically.

## 2021-10-19 NOTE — Progress Notes (Signed)
To pacu, VSS. Report to [email or web address] 

## 2021-10-19 NOTE — Patient Instructions (Signed)
No polyps!!! Next colonoscopy in 10 years  Please read over handout about hemorrhoids Continue your normal medications  YOU HAD AN ENDOSCOPIC PROCEDURE TODAY AT [facility]:   Refer to the procedure report that was given to you for any specific questions about what was found during the examination.  If the procedure report does not answer your questions, please call your gastroenterologist to clarify.  If you requested that your care partner not be given the details of your procedure findings, then the procedure report has been included in a sealed envelope for you to review at your convenience later.  YOU SHOULD EXPECT: Some feelings of bloating in the abdomen. Passage of more gas than usual.  Walking can help get rid of the air that was put into your GI tract during the procedure and reduce the bloating. If you had a lower endoscopy (such as a colonoscopy or flexible sigmoidoscopy) you may notice spotting of blood in your stool or on the toilet paper. If you underwent a bowel prep for your procedure, you may not have a normal bowel movement for a few days.  Please Note:  You might notice some irritation and congestion in your nose or some drainage.  This is from the oxygen used during your procedure.  There is no need for concern and it should clear up in a day or so.  SYMPTOMS TO REPORT IMMEDIATELY:  Following lower endoscopy (colonoscopy or flexible sigmoidoscopy):  Excessive amounts of blood in the stool  Significant tenderness or worsening of abdominal pains  Swelling of the abdomen that is new, acute  Fever of 100F or higher  For urgent or emergent issues, a gastroenterologist can be reached at any hour by calling [phone]. Do not use MyChart messaging for urgent concerns.    DIET:  We do recommend a small meal at first, but then you may proceed to your regular diet.  Drink plenty of fluids but you should avoid alcoholic beverages for 24 hours.  ACTIVITY:  You  should plan to take it easy for the rest of today and you should NOT DRIVE or use heavy machinery until tomorrow (because of the sedation medicines used during the test).    FOLLOW UP: Our staff will call the number listed on your records the next business day following your procedure.  We will call around 7:15- 8:00 am to check on you and address any questions or concerns that you may have regarding the information given to you following your procedure. If we do not reach you, we will leave a message.      SIGNATURES/CONFIDENTIALITY: You and/or your care partner have signed paperwork which will be entered into your electronic medical record.  These signatures attest to the fact that that the information above on your After Visit Summary has been reviewed and is understood.  Full responsibility of the confidentiality of this discharge information lies with you and/or your care-partner.

## 2021-10-20 ENCOUNTER — Telehealth: Payer: Self-pay | Admitting: *Deleted

## 2021-10-20 NOTE — Telephone Encounter (Signed)
Attempted to call patient for their post-procedure follow-up call. No answer. Left voicemail.   

## 2021-10-27 ENCOUNTER — Encounter: Payer: Self-pay | Admitting: Obstetrics and Gynecology

## 2022-06-22 ENCOUNTER — Ambulatory Visit: Payer: Self-pay | Admitting: Obstetrics and Gynecology

## 2022-07-22 ENCOUNTER — Ambulatory Visit: Payer: Self-pay | Admitting: Obstetrics and Gynecology

## 2022-09-02 NOTE — Progress Notes (Signed)
PCP: April Manson, NP   Chief Complaint  Patient presents with   Gynecologic Exam    No concerns    HPI:      Courtney Brooks is a 52 y.o. [phone] whose LMP was No LMP recorded. Patient has had an ablation., presents today for her annual examination.  Her menses are absent again due to endometrial ablation. Had monthly light bleeding with wiping only for 2-3 days few months last yr.  She does have vasomotor sx; started on prometrium 100 mg last yr with sx control for awhile but having increased sx now. Failed OTC estroven/herbal products in past. No hx of breast cancer, stroke, heart disease, uncontrolled HTN.  Sex activity: single partner. She does not have vaginal dryness/pain/bleeding.  Last Pap: 02/05/19 Results were: no abnormalities /neg HPV DNA. No hx of abn paps with tx; did have repeat paps after bx over 30 yrs ago.  Last mammogram: today, awaiting results. 11/20/21 at [facility] was normal, repeat in 6 months. Had neg breast bx 5/23 after Cat 4 LT breast due to palpable mass. There is a FH of breast cancer in her PGM, genetic testing not indicated in past due to age of dx but pt now states she was 65 (not 46). There is no FH of ovarian cancer. The patient does do self-breast exams.  Colonoscopy: 2023 in [place]; repeat after 10 yrs; 2013 due to RT side pain at [facility] in [place];  Repeat due after 10 years.   Tobacco use: The patient denies current or previous tobacco use. Alcohol use: social drinker No drug use Exercise: not active d/t multiple joint probs currently  She does get adequate calcium and Vitamin D in her diet.  Labs with PCP.   Patient Active Problem List   Diagnosis Date Noted   Family history of breast cancer 09/03/2022   Angioedema of lips 05/06/2020   Urticaria 05/06/2020   Other allergic rhinitis 05/06/2020   Arthritis of carpometacarpal (CMC) joint of left thumb 03/07/2020   Arthritis of carpometacarpal [facility]) joint of right thumb 03/07/2020   Chronic  pain 02/09/2018    Past Surgical History:  Procedure Laterality Date   APPENDECTOMY     BACK SURGERY  10/2014   CARPAL TUNNEL RELEASE Bilateral    COLONOSCOPY  08/17/2011   Dr.Jacobs   ENDOMETRIAL ABLATION  03/25/2011   RIGHT OOPHORECTOMY  2006   SHOULDER SURGERY Right    SPINAL CORD STIMULATOR INSERTION N/A 02/09/2018   Procedure: LUMBAR SPINAL CORD STIMULATOR INSERTION;  Surgeon: Venita Lick, MD;  Location: [facility] OR;  Service: Orthopedics;  Laterality: N/A;   THUMB ARTHROSCOPY     TUBAL LIGATION  1993    Family History  Problem Relation Age of Onset   Hypertension Mother    Kidney disease Mother    Diabetes Mother    Heart disease Father    Lung cancer Maternal Grandmother    Hypertension Maternal Grandmother    Breast cancer Paternal Grandmother 42   Colon cancer Neg Hx    Colon polyps Neg Hx    Esophageal cancer Neg Hx    Rectal cancer Neg Hx    Stomach cancer Neg Hx     Social History   Socioeconomic History   Marital status: Married    Spouse name: Not on file   Number of children: 2   Years of education: Not on file   Highest education level: Not on file  Occupational History   Occupation: Systems developer  Manager  Tobacco Use   Smoking status: Never   Smokeless tobacco: Never  Vaping Use   Vaping status: Never Used  Substance and Sexual Activity   Alcohol use: Yes    Alcohol/week: 3.0 - 5.0 standard drinks of alcohol    Types: 3 - 5 Standard drinks or equivalent per week   Drug use: No   Sexual activity: Yes    Birth control/protection: Surgical  Other Topics Concern   Not on file  Social History Narrative   Daily caffeine    Social Determinants of Health   Financial Resource Strain: Low Risk  (02/09/2022)   Received from [facility], [facility]   Overall Financial Resource Strain (CARDIA)    Difficulty of Paying Living Expenses: Not hard at all  Food Insecurity: No Food Insecurity (02/09/2022)   Received [facility]    Hunger Vital Sign    Worried About Running Out of Food in the Last Year: Never true    Ran Out of Food in the Last Year: Never true  Transportation Needs: No Transportation Needs (02/09/2022)   Received from [facility], [facility]   PRAPARE - Transportation    Lack of Transportation (Medical): No    Lack of Transportation (Non-Medical): No  Physical Activity: Unknown (02/09/2022)   Received from [facility], [facility]   Exercise Vital Sign    Days of Exercise per Week: 0 days    Minutes of Exercise per Session: Not on file  Stress: Stress Concern Present (04/20/2022)   Received from [facility], [facility] - Occupational Stress Questionnaire    Feeling of Stress : Rather much  Social Connections: Somewhat Isolated (02/09/2022)   Received from [facility], [facility]   Social Network    How would you rate your social network (family, work, friends)?: Restricted participation with some degree of social isolation  Intimate Partner Violence: Not At Risk (02/09/2022)   Received from [facility], [facility]   HITS    Over the last 12 months how often did your partner physically hurt you?: 1    Over the last 12 months how often did your partner insult you or talk down to you?: 1    Over the last 12 months how often did your partner threaten you with physical harm?: 1    Over the last 12 months how often did your partner scream or curse at you?: 1     Current Outpatient Medications:    acetaminophen (TYLENOL) 500 MG tablet, Take by mouth., Disp: , Rfl:    amLODipine (NORVASC) 5 MG tablet, Take 5 mg by mouth daily., Disp: , Rfl:    amphetamine-dextroamphetamine (ADDERALL XR) 20 MG 24 hr capsule, Take by mouth., Disp: , Rfl:    Cholecalciferol 1.25 MG (50000 UT) capsule, Take by mouth., Disp: , Rfl:    cyclobenzaprine (FLEXERIL) 10 MG tablet, Take 10 mg by mouth at bedtime., Disp: , Rfl:    EPINEPHrine 0.3 mg/0.3 mL IJ SOAJ  injection, Inject into the muscle., Disp: , Rfl:    estradiol (ESTRACE) 0.5 MG tablet, Take 1 tablet (0.5 mg total) by mouth daily., Disp: 90 tablet, Rfl: 0   famotidine (PEPCID) 40 MG tablet, Take 1 tablet by mouth every evening., Disp: , Rfl:    fluticasone (FLONASE) 50 MCG/ACT nasal spray, Place 1 spray into both nostrils 2 (two) times daily., Disp: , Rfl:    HYDROcodone-acetaminophen (NORCO/VICODIN) 5-325 MG tablet,  Take 1 tablet every 4-6 hours by oral route as needed for pain for 5 days., Disp: , Rfl:    ibuprofen (ADVIL) 200 MG tablet, Take 200 mg by mouth every 6 (six) hours as needed., Disp: , Rfl:    lactulose (CHRONULAC) 10 GM/15ML solution, Take by mouth., Disp: , Rfl:    naproxen (NAPROSYN) 500 MG tablet, Take 500 mg by mouth 2 (two) times daily., Disp: , Rfl:    ondansetron (ZOFRAN-ODT) 4 MG disintegrating tablet, Take 4 mg by mouth every 8 (eight) hours as needed., Disp: , Rfl:    rizatriptan (MAXALT-MLT) 10 MG disintegrating tablet, , Disp: , Rfl:    progesterone (PROMETRIUM) 100 MG capsule, TAKE 1 CAPSULE NIGHTLY (6 NIGHTS ON & 1 NIGHT OFF), Disp: 90 capsule, Rfl: 3     ROS:  Review of Systems  Constitutional:  Negative for fatigue, fever and unexpected weight change.  Respiratory:  Negative for cough, shortness of breath and wheezing.   Cardiovascular:  Negative for chest pain, palpitations and leg swelling.  Gastrointestinal:  Negative for blood in stool, constipation, diarrhea, nausea and vomiting.  Endocrine: Negative for cold intolerance, heat intolerance and polyuria.  Genitourinary:  Negative for dyspareunia, dysuria, flank pain, frequency, genital sores, hematuria, menstrual problem, pelvic pain, urgency, vaginal bleeding, vaginal discharge and vaginal pain.  Musculoskeletal:  Positive for arthralgias. Negative for back pain, joint swelling and myalgias.  Skin:  Negative for rash.  Neurological:  Positive for numbness. Negative for dizziness, syncope,  light-headedness and headaches.  Hematological:  Negative for adenopathy.  Psychiatric/Behavioral:  Negative for agitation, confusion, sleep disturbance and suicidal ideas. The patient is not nervous/anxious.    BREAST: mass    Objective: BP 138/86   Ht 5\' 10"  (1.778 m)   Wt 203 lb (92.1 kg)   BMI 29.13 kg/m    Physical Exam Constitutional:      Appearance: She is well-developed.  Genitourinary:     Vulva normal.     Right Labia: No rash, tenderness or lesions.    Left Labia: No tenderness, lesions or rash.    No vaginal discharge, erythema or tenderness.      Right Adnexa: not tender and no mass present.    Left Adnexa: not tender and no mass present.    No cervical friability or polyp.     Uterus is not enlarged or tender.  Breasts:    Right: No mass, nipple discharge, skin change or tenderness.     Left: No mass, nipple discharge, skin change or tenderness.  Neck:     Thyroid: No thyromegaly.  Cardiovascular:     Rate and Rhythm: Normal rate and regular rhythm.     Heart sounds: Normal heart sounds. No murmur heard. Pulmonary:     Effort: Pulmonary effort is normal.     Breath sounds: Normal breath sounds.  Abdominal:     Palpations: Abdomen is soft.     Tenderness: There is no abdominal tenderness. There is no guarding or rebound.  Musculoskeletal:        General: Normal range of motion.     Cervical back: Normal range of motion.  Lymphadenopathy:     Cervical: No cervical adenopathy.  Neurological:     General: No focal deficit present.     Mental Status: She is alert and oriented to person, place, and time.     Cranial Nerves: No cranial nerve deficit.  Skin:    General: Skin is warm and dry.  Psychiatric:  Mood and Affect: Mood normal.        Behavior: Behavior normal.        Thought Content: Thought content normal.        Judgment: Judgment normal.  Vitals reviewed.     Assessment/Plan: Encounter for annual routine gynecological  examination  Cervical cancer screening - Plan: Cytology - PAP  Screening for HPV (human papillomavirus) - Plan: Cytology - PAP  Encounter for screening mammogram for malignant neoplasm of breast - Plan: MM 3D SCREENING MAMMOGRAM BILATERAL BREAST; pt had mammo today  Family history of breast cancer - Plan: [facility]); MyRisk testing discussed and done today. Will f/u with results.   Perimenopausal vasomotor symptoms - Plan: estradiol (ESTRACE) 0.5 MG tablet, progesterone (PROMETRIUM) 100 MG capsule; add ERT since sx worsening, pt more likely postmenopausal now due to age. Rx estradiol, Rx RF prog. Pt to f/u in 3 months via phone re: sx.   Hormone replacement therapy (HRT) - Plan: estradiol (ESTRACE) 0.5 MG tablet, progesterone (PROMETRIUM) 100 MG capsule    Meds ordered this encounter  Medications   estradiol (ESTRACE) 0.5 MG tablet    Sig: Take 1 tablet (0.5 mg total) by mouth daily.    Dispense:  90 tablet    Refill:  0    Order Specific Question:   Supervising Provider    Answer:   Hildred Laser [[id number]]   progesterone (PROMETRIUM) 100 MG capsule    Sig: TAKE 1 CAPSULE NIGHTLY (6 NIGHTS ON & 1 NIGHT OFF)    Dispense:  90 capsule    Refill:  3    Order Specific Question:   Supervising Provider    Answer:   Waymon Budge            GYN counsel breast self exam, mammography screening, use and side effects of HRT, menopause, adequate intake of calcium and vitamin D, diet and exercise    F/U  Return in about 1 year (around 09/03/2023).  Sheniqua Carolan B. Audrey Thull, PA-C 09/03/2022 11:32 AM

## 2022-09-03 ENCOUNTER — Other Ambulatory Visit (HOSPITAL_COMMUNITY)
Admission: RE | Admit: 2022-09-03 | Discharge: 2022-09-03 | Disposition: A | Discharge status: Home / Self Care | Payer: Commercial Managed Care - PPO | Source: Ambulatory Visit | Attending: Obstetrics and Gynecology | Admitting: Obstetrics and Gynecology

## 2022-09-03 ENCOUNTER — Encounter: Payer: Self-pay | Admitting: Obstetrics and Gynecology

## 2022-09-03 ENCOUNTER — Ambulatory Visit: Payer: Commercial Managed Care - PPO | Admitting: Obstetrics and Gynecology

## 2022-09-03 VITALS — BP 138/86 | Ht 70.0 in | Wt 203.0 lb

## 2022-09-03 DIAGNOSIS — Z79890 Hormone replacement therapy (postmenopausal): Secondary | ICD-10-CM

## 2022-09-03 DIAGNOSIS — N951 Menopausal and female climacteric states: Secondary | ICD-10-CM

## 2022-09-03 DIAGNOSIS — Z01419 Encounter for gynecological examination (general) (routine) without abnormal findings: Secondary | ICD-10-CM

## 2022-09-03 DIAGNOSIS — Z1151 Encounter for screening for human papillomavirus (HPV): Secondary | ICD-10-CM | POA: Diagnosis present

## 2022-09-03 DIAGNOSIS — Z1231 Encounter for screening mammogram for malignant neoplasm of breast: Secondary | ICD-10-CM

## 2022-09-03 DIAGNOSIS — Z124 Encounter for screening for malignant neoplasm of cervix: Secondary | ICD-10-CM | POA: Insufficient documentation

## 2022-09-03 DIAGNOSIS — Z1211 Encounter for screening for malignant neoplasm of colon: Secondary | ICD-10-CM

## 2022-09-03 DIAGNOSIS — Z803 Family history of malignant neoplasm of breast: Secondary | ICD-10-CM

## 2022-09-03 MED ORDER — PROGESTERONE MICRONIZED 100 MG PO CAPS
ORAL_CAPSULE | ORAL | 3 refills | Status: DC
Start: 2022-09-03 — End: 2023-09-07

## 2022-09-03 MED ORDER — ESTRADIOL 0.5 MG PO TABS
0.5000 mg | ORAL_TABLET | Freq: Every day | ORAL | 0 refills | Status: DC
Start: 2022-09-03 — End: 2023-01-11

## 2022-09-03 NOTE — Patient Instructions (Signed)
I value your feedback and you entrusting us with your care. If you get a [facility] patient survey, I would appreciate you taking the time to let us know about your experience today. Thank you! ? ? ?

## 2022-09-05 DIAGNOSIS — Z1371 Encounter for nonprocreative screening for genetic disease carrier status: Secondary | ICD-10-CM

## 2022-09-05 HISTORY — DX: Encounter for nonprocreative screening for genetic disease carrier status: Z13.71

## 2022-09-09 LAB — CYTOLOGY - PAP
Adequacy: ABSENT
Comment: NEGATIVE
Diagnosis: UNDETERMINED — AB
High risk HPV: NEGATIVE

## 2022-09-20 ENCOUNTER — Encounter: Payer: Self-pay | Admitting: Obstetrics and Gynecology

## 2022-09-21 ENCOUNTER — Telehealth: Payer: Self-pay | Admitting: Obstetrics and Gynecology

## 2022-09-21 ENCOUNTER — Encounter: Payer: Self-pay | Admitting: Obstetrics and Gynecology

## 2022-09-21 NOTE — Telephone Encounter (Signed)
Pt aware of neg MyRisk results. No IBIS/riskscore due to hx of atypia on breast bx. Being followed yearly with mammograms.   Patient understands these results only apply to her and her children, and this is not indicative of genetic testing results of her other family members. It is recommended that her other family members have genetic testing done.  Pt also understands negative genetic testing doesn't mean she will never get any of these cancers.   Hard copy mailed to pt. F/u prn.

## 2022-11-03 ENCOUNTER — Other Ambulatory Visit: Payer: Self-pay | Admitting: Obstetrics and Gynecology

## 2022-11-03 DIAGNOSIS — N951 Menopausal and female climacteric states: Secondary | ICD-10-CM

## 2022-11-03 DIAGNOSIS — Z79890 Hormone replacement therapy (postmenopausal): Secondary | ICD-10-CM

## 2022-11-29 ENCOUNTER — Other Ambulatory Visit: Payer: Self-pay | Admitting: Obstetrics and Gynecology

## 2022-11-29 DIAGNOSIS — N951 Menopausal and female climacteric states: Secondary | ICD-10-CM

## 2022-11-29 DIAGNOSIS — Z79890 Hormone replacement therapy (postmenopausal): Secondary | ICD-10-CM

## 2023-01-06 ENCOUNTER — Other Ambulatory Visit: Payer: Self-pay | Admitting: Obstetrics and Gynecology

## 2023-01-06 DIAGNOSIS — Z79890 Hormone replacement therapy (postmenopausal): Secondary | ICD-10-CM

## 2023-01-06 DIAGNOSIS — N951 Menopausal and female climacteric states: Secondary | ICD-10-CM

## 2023-01-11 ENCOUNTER — Telehealth: Payer: Self-pay | Admitting: Obstetrics and Gynecology

## 2023-01-11 ENCOUNTER — Telehealth: Payer: Commercial Managed Care - PPO | Admitting: Obstetrics and Gynecology

## 2023-01-11 DIAGNOSIS — N951 Menopausal and female climacteric states: Secondary | ICD-10-CM

## 2023-01-11 DIAGNOSIS — Z79890 Hormone replacement therapy (postmenopausal): Secondary | ICD-10-CM

## 2023-01-11 MED ORDER — ESTRADIOL 0.5 MG PO TABS
0.5000 mg | ORAL_TABLET | Freq: Every day | ORAL | 2 refills | Status: DC
Start: 2023-01-11 — End: 2023-09-07

## 2023-01-11 NOTE — Telephone Encounter (Signed)
 Pt started on estradiol  0.5 mg at annual 8/24 due to increased VS sx; was already on prometrium  100 mg. Doing well, sx improved, no complaints. Wants to cont. Rx RF eRxd till 8/25 annual. F/u prn.  Meds ordered this encounter  Medications   estradiol  (ESTRACE ) 0.5 MG tablet    Sig: Take 1 tablet (0.5 mg total) by mouth daily.    Dispense:  90 tablet    Refill:  2    Supervising Provider:   CHERRY, ANIKA [[id number]]

## 2023-09-06 NOTE — Progress Notes (Unsigned)
 PCP: Teresa Aldona CROME, NP   No chief complaint on file.   HPI:      Ms. Courtney Brooks is a 53 y.o. [phone] whose LMP was No LMP recorded. Patient has had an ablation., presents today for her annual examination.  Her menses are absent again due to endometrial ablation. Had monthly light bleeding with wiping only for 2-3 days few months last yr.  She does have vasomotor sx; started on prometrium  100 mg last yr with sx control for awhile but having increased sx now. Failed OTC estroven/herbal products in past. No hx of breast cancer, stroke, heart disease, uncontrolled HTN.  Sex activity: single partner. She does not have vaginal dryness/pain/bleeding.  Last Pap: 09/03/22 Results were ASCUS/neg HPV DNA; repeat pap due 02/05/19 Results were: no abnormalities /neg HPV DNA. No hx of abn paps with tx; did have repeat paps after bx over 30 yrs ago.  Last mammogram: 09/03/22 at [facility] was normal, repeat in 12 months. Had neg breast bx 5/23 after Cat 4 LT breast due to palpable mass. There is a FH of breast cancer in her PGM, genetic testing not indicated in past due to age of dx but pt now states she was 47 (not 46). There is no FH of ovarian cancer. The patient does do self-breast exams.Pt aware of neg MyRisk results 9/24. No IBIS/riskscore due to hx of atypia on breast bx. Being followed yearly with mammograms.   Colonoscopy: 2023 in [place]; repeat after 10 yrs; 2013 due to RT side pain at [facility] in [place];  Repeat due after 10 years.   Tobacco use: The patient denies current or previous tobacco use. Alcohol use: social drinker No drug use Exercise: not active d/t multiple joint probs currently  She does get adequate calcium and Vitamin D in her diet.  Labs with PCP.   Patient Active Problem List   Diagnosis Date Noted   Family history of breast cancer 09/03/2022   Angioedema of lips 05/06/2020   Urticaria 05/06/2020   Other allergic rhinitis 05/06/2020   Arthritis of carpometacarpal (CMC)  joint of left thumb 03/07/2020   Arthritis of carpometacarpal [facility]) joint of right thumb 03/07/2020   Chronic pain 02/09/2018    Past Surgical History:  Procedure Laterality Date   APPENDECTOMY     BACK SURGERY  10/2014   CARPAL TUNNEL RELEASE Bilateral    COLONOSCOPY  08/17/2011   Dr.Jacobs   ENDOMETRIAL ABLATION  03/25/2011   RIGHT OOPHORECTOMY  2006   SHOULDER SURGERY Right    SPINAL CORD STIMULATOR INSERTION N/A 02/09/2018   Procedure: LUMBAR SPINAL CORD STIMULATOR INSERTION;  Surgeon: Burnetta Aures, MD;  Location: [facility] OR;  Service: Orthopedics;  Laterality: N/A;   THUMB ARTHROSCOPY     TUBAL LIGATION  1993    Family History  Problem Relation Age of Onset   Hypertension Mother    Kidney disease Mother    Diabetes Mother    Heart disease Father    Lung cancer Maternal Grandmother    Hypertension Maternal Grandmother    Breast cancer Paternal Grandmother 52   Colon cancer Neg Hx    Colon polyps Neg Hx    Esophageal cancer Neg Hx    Rectal cancer Neg Hx    Stomach cancer Neg Hx     Social History   Socioeconomic History   Marital status: Married    Spouse name: Not on file   Number of children: 2   Years of education: Not  on file   Highest education level: Not on file  Occupational History   Occupation: Furniture conservator/restorer  Tobacco Use   Smoking status: Never   Smokeless tobacco: Never  Vaping Use   Vaping status: Never Used  Substance and Sexual Activity   Alcohol use: Yes    Alcohol/week: 3.0 - 5.0 standard drinks of alcohol    Types: 3 - 5 Standard drinks or equivalent per week   Drug use: No   Sexual activity: Yes    Birth control/protection: Surgical  Other Topics Concern   Not on file  Social History Narrative   Daily caffeine    Social Drivers of Health   Financial Resource Strain: Low Risk  (02/09/2023)   Received from [facility]   Overall Financial Resource Strain (CARDIA)    Difficulty of Paying Living Expenses: Not hard at all  Food  Insecurity: No Food Insecurity (02/09/2023)   Received from [facility]   Hunger Vital Sign    Within the past 12 months, you worried that your food would run out before you got the money to buy more.: Never true    Within the past 12 months, the food you bought just didn't last and you didn't have money to get more.: Never true  Transportation Needs: No Transportation Needs (02/09/2023)   Received from [facility] - Transportation    Lack of Transportation (Medical): No    Lack of Transportation (Non-Medical): No  Physical Activity: Unknown (02/09/2023)   Received from [facility]   Exercise Vital Sign    On average, how many days per week do you engage in moderate to strenuous exercise (like a brisk walk)?: 0 days    Minutes of Exercise per Session: Not on file  Recent Concern: Physical Activity - Inactive (12/12/2022)   Received from [facility]   Exercise Vital Sign    On average, how many days per week do you engage in moderate to strenuous exercise (like a brisk walk)?: 0 days    On average, how many minutes do you engage in exercise at this level?: 0 min  Stress: Stress Concern Present (02/09/2023)   Received from [facility] - Occupational Stress Questionnaire    Feeling of Stress : To some extent  Social Connections: Somewhat Isolated (02/09/2023)   Received from [facility]   Social Network    How would you rate your social network (family, work, friends)?: Restricted participation with some degree of social isolation  Intimate Partner Violence: Not At Risk (02/09/2023)   Received from [facility]   HITS    Over the last 12 months how often did your partner physically hurt you?: Never    Over the last 12 months how often did your partner insult you or talk down to you?: Never    Over the last 12 months how often did your partner threaten you with physical harm?: Never    Over the last 12 months how often did your  partner scream or curse at you?: Never     Current Outpatient Medications:    acetaminophen  (TYLENOL ) 500 MG tablet, Take by mouth., Disp: , Rfl:    amLODipine (NORVASC) 5 MG tablet, Take 5 mg by mouth daily., Disp: , Rfl:    amphetamine-dextroamphetamine (ADDERALL XR) 20 MG 24 hr capsule, Take by mouth., Disp: , Rfl:    Cholecalciferol 1.25 MG (50000 UT) capsule, Take by mouth., Disp: , Rfl:  cyclobenzaprine  (FLEXERIL ) 10 MG tablet, Take 10 mg by mouth at bedtime., Disp: , Rfl:    EPINEPHrine  0.3 mg/0.3 mL IJ SOAJ injection, Inject into the muscle., Disp: , Rfl:    estradiol  (ESTRACE ) 0.5 MG tablet, Take 1 tablet (0.5 mg total) by mouth daily., Disp: 90 tablet, Rfl: 2   famotidine (PEPCID) 40 MG tablet, Take 1 tablet by mouth every evening., Disp: , Rfl:    fluticasone  (FLONASE ) 50 MCG/ACT nasal spray, Place 1 spray into both nostrils 2 (two) times daily., Disp: , Rfl:    HYDROcodone -acetaminophen  (NORCO/VICODIN) 5-325 MG tablet, Take 1 tablet every 4-6 hours by oral route as needed for pain for 5 days., Disp: , Rfl:    ibuprofen (ADVIL) 200 MG tablet, Take 200 mg by mouth every 6 (six) hours as needed., Disp: , Rfl:    lactulose (CHRONULAC) 10 GM/15ML solution, Take by mouth., Disp: , Rfl:    naproxen  (NAPROSYN ) 500 MG tablet, Take 500 mg by mouth 2 (two) times daily., Disp: , Rfl:    ondansetron  (ZOFRAN -ODT) 4 MG disintegrating tablet, Take 4 mg by mouth every 8 (eight) hours as needed., Disp: , Rfl:    progesterone  (PROMETRIUM ) 100 MG capsule, TAKE 1 CAPSULE NIGHTLY (6 NIGHTS ON & 1 NIGHT OFF), Disp: 90 capsule, Rfl: 3   rizatriptan (MAXALT-MLT) 10 MG disintegrating tablet, , Disp: , Rfl:      ROS:  Review of Systems  Constitutional:  Negative for fatigue, fever and unexpected weight change.  Respiratory:  Negative for cough, shortness of breath and wheezing.   Cardiovascular:  Negative for chest pain, palpitations and leg swelling.  Gastrointestinal:  Negative for blood in  stool, constipation, diarrhea, nausea and vomiting.  Endocrine: Negative for cold intolerance, heat intolerance and polyuria.  Genitourinary:  Negative for dyspareunia, dysuria, flank pain, frequency, genital sores, hematuria, menstrual problem, pelvic pain, urgency, vaginal bleeding, vaginal discharge and vaginal pain.  Musculoskeletal:  Positive for arthralgias. Negative for back pain, joint swelling and myalgias.  Skin:  Negative for rash.  Neurological:  Positive for numbness. Negative for dizziness, syncope, light-headedness and headaches.  Hematological:  Negative for adenopathy.  Psychiatric/Behavioral:  Negative for agitation, confusion, sleep disturbance and suicidal ideas. The patient is not nervous/anxious.    BREAST: mass    Objective: There were no vitals taken for this visit.   Physical Exam Constitutional:      Appearance: She is well-developed.  Genitourinary:     Vulva normal.     Right Labia: No rash, tenderness or lesions.    Left Labia: No tenderness, lesions or rash.    No vaginal discharge, erythema or tenderness.      Right Adnexa: not tender and no mass present.    Left Adnexa: not tender and no mass present.    No cervical friability or polyp.     Uterus is not enlarged or tender.  Breasts:    Right: No mass, nipple discharge, skin change or tenderness.     Left: No mass, nipple discharge, skin change or tenderness.  Neck:     Thyroid : No thyromegaly.  Cardiovascular:     Rate and Rhythm: Normal rate and regular rhythm.     Heart sounds: Normal heart sounds. No murmur heard. Pulmonary:     Effort: Pulmonary effort is normal.     Breath sounds: Normal breath sounds.  Abdominal:     Palpations: Abdomen is soft.     Tenderness: There is no abdominal tenderness. There is no guarding  or rebound.  Musculoskeletal:        General: Normal range of motion.     Cervical back: Normal range of motion.  Lymphadenopathy:     Cervical: No cervical adenopathy.   Neurological:     General: No focal deficit present.     Mental Status: She is alert and oriented to person, place, and time.     Cranial Nerves: No cranial nerve deficit.  Skin:    General: Skin is warm and dry.  Psychiatric:        Mood and Affect: Mood normal.        Behavior: Behavior normal.        Thought Content: Thought content normal.        Judgment: Judgment normal.  Vitals reviewed.     Assessment/Plan: Encounter for annual routine gynecological examination  Cervical cancer screening - Plan: Cytology - PAP  Screening for HPV (human papillomavirus) - Plan: Cytology - PAP  Encounter for screening mammogram for malignant neoplasm of breast - Plan: MM 3D SCREENING MAMMOGRAM BILATERAL BREAST; pt had mammo today  Family history of breast cancer - Plan: [facility]); MyRisk testing discussed and done today. Will f/u with results.   Perimenopausal vasomotor symptoms - Plan: estradiol  (ESTRACE ) 0.5 MG tablet, progesterone  (PROMETRIUM ) 100 MG capsule; add ERT since sx worsening, pt more likely postmenopausal now due to age. Rx estradiol , Rx RF prog. Pt to f/u in 3 months via phone re: sx.   Hormone replacement therapy (HRT) - Plan: estradiol  (ESTRACE ) 0.5 MG tablet, progesterone  (PROMETRIUM ) 100 MG capsule    No orders of the defined types were placed in this encounter.           GYN counsel breast self exam, mammography screening, use and side effects of HRT, menopause, adequate intake of calcium and vitamin D, diet and exercise    F/U  No follow-ups on file.  Lazar Tierce B. Eleanora Guinyard, PA-C 09/06/2023 5:01 PM

## 2023-09-07 ENCOUNTER — Other Ambulatory Visit (HOSPITAL_COMMUNITY)
Admission: RE | Admit: 2023-09-07 | Discharge: 2023-09-07 | Disposition: A | Discharge status: Home / Self Care | Source: Ambulatory Visit | Attending: Obstetrics and Gynecology | Admitting: Obstetrics and Gynecology

## 2023-09-07 ENCOUNTER — Ambulatory Visit: Admitting: Obstetrics and Gynecology

## 2023-09-07 ENCOUNTER — Encounter: Payer: Self-pay | Admitting: Obstetrics and Gynecology

## 2023-09-07 VITALS — BP 107/76 | HR 101 | Ht 70.0 in | Wt 194.0 lb

## 2023-09-07 DIAGNOSIS — N951 Menopausal and female climacteric states: Secondary | ICD-10-CM | POA: Diagnosis not present

## 2023-09-07 DIAGNOSIS — Z01419 Encounter for gynecological examination (general) (routine) without abnormal findings: Secondary | ICD-10-CM | POA: Diagnosis not present

## 2023-09-07 DIAGNOSIS — Z124 Encounter for screening for malignant neoplasm of cervix: Secondary | ICD-10-CM

## 2023-09-07 DIAGNOSIS — Z1231 Encounter for screening mammogram for malignant neoplasm of breast: Secondary | ICD-10-CM

## 2023-09-07 DIAGNOSIS — Z79890 Hormone replacement therapy (postmenopausal): Secondary | ICD-10-CM

## 2023-09-07 DIAGNOSIS — R87610 Atypical squamous cells of undetermined significance on cytologic smear of cervix (ASC-US): Secondary | ICD-10-CM | POA: Insufficient documentation

## 2023-09-07 DIAGNOSIS — Z1151 Encounter for screening for human papillomavirus (HPV): Secondary | ICD-10-CM | POA: Insufficient documentation

## 2023-09-07 DIAGNOSIS — Z803 Family history of malignant neoplasm of breast: Secondary | ICD-10-CM

## 2023-09-07 MED ORDER — ESTRADIOL 0.5 MG PO TABS
0.5000 mg | ORAL_TABLET | Freq: Every day | ORAL | 3 refills | Status: AC
Start: 2023-09-07 — End: ?

## 2023-09-07 MED ORDER — PROGESTERONE MICRONIZED 100 MG PO CAPS: ORAL_CAPSULE | ORAL | 3 refills | Status: AC

## 2023-09-07 NOTE — Addendum Note (Signed)
 Addended by: WATT HILA B on: 09/07/2023 11:30 AM   Modules accepted: Orders

## 2023-09-07 NOTE — Patient Instructions (Signed)
 I value your feedback and you entrusting [place] with your care. If you get a [place] patient survey, I would appreciate you taking the time to let us know about your experience today. Thank you! ? ? ?

## 2023-09-12 ENCOUNTER — Ambulatory Visit: Payer: Self-pay | Admitting: Obstetrics and Gynecology

## 2023-09-12 LAB — CYTOLOGY - PAP
Comment: NEGATIVE
Diagnosis: UNDETERMINED — AB
High risk HPV: NEGATIVE

## 2024-03-28 ENCOUNTER — Ambulatory Visit: Admitting: Diagnostic Neuroimaging
